# Patient Record
Sex: Male | Born: 1940 | Race: White | Hispanic: No
Health system: Southern US, Community
[De-identification: ages and names within clinical notes are randomized; demographics above are authoritative.]

## PROBLEM LIST (undated history)

## (undated) DIAGNOSIS — F0390 Unspecified dementia without behavioral disturbance: Secondary | ICD-10-CM

## (undated) HISTORY — PX: OTHER SURGICAL HISTORY: SHX169

---

## 2020-10-06 ENCOUNTER — Emergency Department: Payer: Medicare Other

## 2020-10-06 ENCOUNTER — Other Ambulatory Visit: Payer: Self-pay

## 2020-10-06 ENCOUNTER — Emergency Department
Admission: EM | Admit: 2020-10-06 | Discharge: 2020-10-07 | Disposition: A | Discharge status: Home / Self Care | Payer: Medicare Other | Attending: Emergency Medicine | Admitting: Emergency Medicine

## 2020-10-06 DIAGNOSIS — F0390 Unspecified dementia without behavioral disturbance: Secondary | ICD-10-CM | POA: Diagnosis not present

## 2020-10-06 DIAGNOSIS — G319 Degenerative disease of nervous system, unspecified: Secondary | ICD-10-CM | POA: Insufficient documentation

## 2020-10-06 DIAGNOSIS — Z20822 Contact with and (suspected) exposure to covid-19: Secondary | ICD-10-CM | POA: Diagnosis not present

## 2020-10-06 DIAGNOSIS — E860 Dehydration: Secondary | ICD-10-CM | POA: Insufficient documentation

## 2020-10-06 DIAGNOSIS — R4182 Altered mental status, unspecified: Secondary | ICD-10-CM | POA: Diagnosis present

## 2020-10-06 DIAGNOSIS — R531 Weakness: Secondary | ICD-10-CM

## 2020-10-06 HISTORY — DX: Unspecified dementia, unspecified severity, without behavioral disturbance, psychotic disturbance, mood disturbance, and anxiety: F03.90

## 2020-10-06 LAB — COMPREHENSIVE METABOLIC PANEL
ALT: 23 U/L (ref 0–44)
AST: 59 U/L — ABNORMAL HIGH (ref 15–41)
Albumin: 4 g/dL (ref 3.5–5.0)
Alkaline Phosphatase: 37 U/L — ABNORMAL LOW (ref 38–126)
Anion gap: 10 (ref 5–15)
BUN: 31 mg/dL — ABNORMAL HIGH (ref 8–23)
CO2: 26 mmol/L (ref 22–32)
Calcium: 9.1 mg/dL (ref 8.9–10.3)
Chloride: 103 mmol/L (ref 98–111)
Creatinine, Ser: 0.67 mg/dL (ref 0.61–1.24)
GFR, Estimated: >60 mL/min (ref >60)
Glucose, Bld: 121 mg/dL — ABNORMAL HIGH (ref 70–99)
Potassium: 3.7 mmol/L (ref 3.5–5.1)
Sodium: 139 mmol/L (ref 135–145)
Total Bilirubin: 0.8 mg/dL (ref 0.3–1.2)
Total Protein: 7.6 g/dL (ref 6.5–8.1)

## 2020-10-06 LAB — CBC
HCT: 38.6 % — ABNORMAL LOW (ref 39.0–52.0)
Hemoglobin: 13 g/dL (ref 13.0–17.0)
MCH: 32.7 pg (ref 26.0–34.0)
MCHC: 33.7 g/dL (ref 30.0–36.0)
MCV: 97 fL (ref 80.0–100.0)
Platelets: 246 10*3/uL (ref 150–400)
RBC: 3.98 MIL/uL — ABNORMAL LOW (ref 4.22–5.81)
RDW: 14 % (ref 11.5–15.5)
WBC: 10.4 10*3/uL (ref 4.0–10.5)
nRBC: 0 % (ref 0.0–0.2)

## 2020-10-06 LAB — URINALYSIS, COMPLETE (UACMP) WITH MICROSCOPIC
Bacteria, UA: NONE SEEN
Bilirubin Urine: NEGATIVE
Glucose, UA: NEGATIVE mg/dL
Hgb urine dipstick: NEGATIVE
Ketones, ur: 20 mg/dL — AB
Leukocytes,Ua: NEGATIVE
Nitrite: NEGATIVE
Protein, ur: 30 mg/dL — AB
Specific Gravity, Urine: 1.036 — ABNORMAL HIGH (ref 1.005–1.030)
pH: 5 (ref 5.0–8.0)

## 2020-10-06 LAB — LACTIC ACID, PLASMA: Lactic Acid, Venous: 1 mmol/L (ref 0.5–1.9)

## 2020-10-06 LAB — RESP PANEL BY RT-PCR (FLU A&B, COVID) ARPGX2
Influenza A by PCR: NEGATIVE
Influenza B by PCR: NEGATIVE
SARS Coronavirus 2 by RT PCR: NEGATIVE

## 2020-10-06 NOTE — ED Notes (Signed)
Patient transported to CT 

## 2020-10-06 NOTE — ED Triage Notes (Signed)
Patient sent by PCP for contaminated blood work that was collected this AM. From [facility]. Staff reports altered from baseline,lethargic, and unable to walk.

## 2020-10-06 NOTE — ED Notes (Signed)
Pt currently resting in NAD. Stretcher locked in lowest position with side rails raised for fall prevention.

## 2020-10-06 NOTE — ED Provider Notes (Signed)
[facility] Emergency Department Provider Note   ____________________________________________   Event Date/Time   First MD Initiated Contact with Patient 10/06/20 1940     (approximate)  I have reviewed the triage vital signs and the nursing notes.   HISTORY  Chief Complaint Weakness    HPI Duane Anderson is a 80 y.o. male with a past medical history of dementia who presents from his long-term care facility via EMS for altered mental status.  Per staff patient was "not as arousable as usual" and has had difficulty ambulating over the last few days.  Staff does not provide any further history at this time.  Patient is unable to participate in history or review of systems but does not complain of any pain or other complaints at this time          Past Medical History:  Diagnosis Date   Dementia (HCC)     There are no problems to display for this patient.    Prior to Admission medications   Not on File    Allergies Patient has no known allergies.  No family history on file.  Social History    Review of Systems Unable to assess   ____________________________________________   PHYSICAL EXAM:  VITAL SIGNS: ED Triage Vitals [10/06/20 1653]  Enc Vitals Group     BP 93/61     Pulse Rate 84     Resp 18     Temp 98.6 F (37 C)     Temp Source Oral     SpO2 100 %     Weight 160 lb (72.6 kg)     Height 5\' 8"  (1.727 m)     Head Circumference      Peak Flow      Pain Score      Pain Loc      Pain Edu?      Excl. in GC?    Constitutional: Alert and disoriented.  Disheveled elderly Caucasian male resting comfortably on stretcher in no acute distress. Eyes: Conjunctivae are normal. PERRL. Head: Atraumatic. Nose: No congestion/rhinnorhea. Mouth/Throat: Mucous membranes are moist. Neck: No stridor Cardiovascular: Grossly normal heart sounds.  Good peripheral circulation. Respiratory: Normal respiratory effort.  No  retractions. Gastrointestinal: Soft and nontender. No distention. Musculoskeletal: No obvious deformities Neurologic:  Normal speech and language.  Moves all extremities spontaneously Skin:  Skin is warm and dry. No rash noted. Psychiatric: Cooperative  ____________________________________________   LABS (all labs ordered are listed, but only abnormal results are displayed)  Labs Reviewed  CBC - Abnormal; Notable for the following components:      Result Value   RBC 3.98 (*)    HCT 38.6 (*)    All other components within normal limits  COMPREHENSIVE METABOLIC PANEL - Abnormal; Notable for the following components:   Glucose, Bld 121 (*)    BUN 31 (*)    AST 59 (*)    Alkaline Phosphatase 37 (*)    All other components within normal limits  URINALYSIS, COMPLETE (UACMP) WITH MICROSCOPIC - Abnormal; Notable for the following components:   Color, Urine AMBER (*)    APPearance HAZY (*)    Specific Gravity, Urine 1.036 (*)    Ketones, ur 20 (*)    Protein, ur 30 (*)    All other components within normal limits  RESP PANEL BY RT-PCR (FLU A&B, COVID) ARPGX2  LACTIC ACID, PLASMA   RADIOLOGY  ED MD interpretation: CT of the head without contrast shows  no evidence of acute abnormalities including no intracerebral hemorrhage, obvious masses, or significant edema.  There is generalized cerebral atrophy most severe in the temporal lobes  Official radiology report(s): CT Head Wo Contrast  Result Date: 10/06/2020 CLINICAL DATA:  Mental status change EXAM: CT HEAD WITHOUT CONTRAST TECHNIQUE: Contiguous axial images were obtained from the base of the skull through the vertex without intravenous contrast. COMPARISON:  None. FINDINGS: Brain: Patient scanned in the decubitus position. Generalized atrophy. Prominent atrophy in the temporal lobes. Ventricular enlargement consistent with atrophy. Mild white matter hypodensity bilaterally. Negative for acute infarct, hemorrhage, mass Vascular:  Negative for hyperdense vessel Skull: Negative Sinuses/Orbits: Retention cyst left maxillary sinus. Remaining sinuses clear. Bilateral cataract extraction Other: None IMPRESSION: Generalized atrophy most severe in the temporal lobes. No acute abnormality. Electronically Signed   By: Marlan Palau M.D.   On: 10/06/2020 20:31    ____________________________________________   PROCEDURES  Procedure(s) performed (including Critical Care):  .1-3 Lead EKG Interpretation  Date/Time: 10/06/2020 8:51 PM Performed by: Merwyn Katos, MD Authorized by: Merwyn Katos, MD     Interpretation: normal     ECG rate:  89   ECG rate assessment: normal     Rhythm: sinus rhythm     Ectopy: none     Conduction: normal     ____________________________________________   INITIAL IMPRESSION / ASSESSMENT AND PLAN / ED COURSE  As part of my medical decision making, I reviewed the following data within the electronic medical record, if available:  Nursing notes reviewed and incorporated, Labs reviewed, EKG interpreted, Old chart reviewed, Radiograph reviewed and Notes from prior ED visits reviewed and incorporated        The patient suffered an episode of altered mental status, but there is no overt concern for a dangerous emergent cause such as, but not limited to, CNS infection, severe Toxidrome, severe metabolic derangement, or stroke.  Given History, Physical, and Workup the cause appears to be possible dehydration  Disposition: Discharge. At the time of discharge, the patient is back to presumed baseline mental status.      ____________________________________________   FINAL CLINICAL IMPRESSION(S) / ED DIAGNOSES  Final diagnoses:  Generalized weakness  Dehydration  Diffuse cerebral atrophy [facility])     ED Discharge Orders     None        Note:  This document was prepared using Dragon voice recognition software and may include unintentional dictation errors.    Merwyn Katos, MD 10/06/20 2051

## 2020-10-06 NOTE — ED Notes (Signed)
Spoke to POA/pt's son and explained dispo. No further questions or concerns.

## 2020-10-13 ENCOUNTER — Inpatient Hospital Stay
Admission: EM | Admit: 2020-10-13 | Discharge: 2020-10-23 | DRG: 884 | Disposition: A | Discharge status: Hospice | Payer: Medicare Other | Source: Skilled Nursing Facility | Attending: Internal Medicine | Admitting: Internal Medicine

## 2020-10-13 ENCOUNTER — Emergency Department: Payer: Medicare Other

## 2020-10-13 ENCOUNTER — Other Ambulatory Visit: Payer: Self-pay

## 2020-10-13 DIAGNOSIS — M6282 Rhabdomyolysis: Secondary | ICD-10-CM | POA: Diagnosis present

## 2020-10-13 DIAGNOSIS — E860 Dehydration: Secondary | ICD-10-CM | POA: Diagnosis present

## 2020-10-13 DIAGNOSIS — G9341 Metabolic encephalopathy: Secondary | ICD-10-CM | POA: Diagnosis present

## 2020-10-13 DIAGNOSIS — Z66 Do not resuscitate: Secondary | ICD-10-CM | POA: Diagnosis present

## 2020-10-13 DIAGNOSIS — E538 Deficiency of other specified B group vitamins: Secondary | ICD-10-CM | POA: Diagnosis present

## 2020-10-13 DIAGNOSIS — F0390 Unspecified dementia without behavioral disturbance: Secondary | ICD-10-CM | POA: Diagnosis present

## 2020-10-13 DIAGNOSIS — Z6825 Body mass index (BMI) 25.0-25.9, adult: Secondary | ICD-10-CM

## 2020-10-13 DIAGNOSIS — Z7401 Bed confinement status: Secondary | ICD-10-CM

## 2020-10-13 DIAGNOSIS — Z79899 Other long term (current) drug therapy: Secondary | ICD-10-CM

## 2020-10-13 DIAGNOSIS — R627 Adult failure to thrive: Secondary | ICD-10-CM | POA: Diagnosis present

## 2020-10-13 DIAGNOSIS — E440 Moderate protein-calorie malnutrition: Secondary | ICD-10-CM | POA: Diagnosis present

## 2020-10-13 DIAGNOSIS — R748 Abnormal levels of other serum enzymes: Secondary | ICD-10-CM | POA: Diagnosis not present

## 2020-10-13 DIAGNOSIS — G9340 Encephalopathy, unspecified: Secondary | ICD-10-CM | POA: Diagnosis present

## 2020-10-13 DIAGNOSIS — N19 Unspecified kidney failure: Secondary | ICD-10-CM | POA: Diagnosis not present

## 2020-10-13 DIAGNOSIS — D72829 Elevated white blood cell count, unspecified: Secondary | ICD-10-CM | POA: Diagnosis present

## 2020-10-13 DIAGNOSIS — E8809 Other disorders of plasma-protein metabolism, not elsewhere classified: Secondary | ICD-10-CM | POA: Diagnosis present

## 2020-10-13 DIAGNOSIS — Z20822 Contact with and (suspected) exposure to covid-19: Secondary | ICD-10-CM | POA: Diagnosis present

## 2020-10-13 DIAGNOSIS — R4182 Altered mental status, unspecified: Secondary | ICD-10-CM

## 2020-10-13 LAB — CBC WITH DIFFERENTIAL/PLATELET
Abs Immature Granulocytes: 0.09 10*3/uL — ABNORMAL HIGH (ref 0.00–0.07)
Basophils Absolute: 0.1 10*3/uL (ref 0.0–0.1)
Basophils Relative: 1 %
Eosinophils Absolute: 0.2 10*3/uL (ref 0.0–0.5)
Eosinophils Relative: 2 %
HCT: 33.7 % — ABNORMAL LOW (ref 39.0–52.0)
Hemoglobin: 11.8 g/dL — ABNORMAL LOW (ref 13.0–17.0)
Immature Granulocytes: 1 %
Lymphocytes Relative: 14 %
Lymphs Abs: 1.6 10*3/uL (ref 0.7–4.0)
MCH: 33.7 pg (ref 26.0–34.0)
MCHC: 35 g/dL (ref 30.0–36.0)
MCV: 96.3 fL (ref 80.0–100.0)
Monocytes Absolute: 1.2 10*3/uL — ABNORMAL HIGH (ref 0.1–1.0)
Monocytes Relative: 11 %
Neutro Abs: 8 10*3/uL — ABNORMAL HIGH (ref 1.7–7.7)
Neutrophils Relative %: 71 %
Platelets: 313 10*3/uL (ref 150–400)
RBC: 3.5 MIL/uL — ABNORMAL LOW (ref 4.22–5.81)
RDW: 13.6 % (ref 11.5–15.5)
WBC: 11.1 10*3/uL — ABNORMAL HIGH (ref 4.0–10.5)
nRBC: 0 % (ref 0.0–0.2)

## 2020-10-13 LAB — TROPONIN I (HIGH SENSITIVITY)
Troponin I (High Sensitivity): 9 ng/L (ref <18)
Troponin I (High Sensitivity): 10 ng/L (ref <18)

## 2020-10-13 LAB — COMPREHENSIVE METABOLIC PANEL
ALT: 27 U/L (ref 0–44)
AST: 51 U/L — ABNORMAL HIGH (ref 15–41)
Albumin: 3.3 g/dL — ABNORMAL LOW (ref 3.5–5.0)
Alkaline Phosphatase: 41 U/L (ref 38–126)
Anion gap: 11 (ref 5–15)
BUN: 30 mg/dL — ABNORMAL HIGH (ref 8–23)
CO2: 23 mmol/L (ref 22–32)
Calcium: 8.7 mg/dL — ABNORMAL LOW (ref 8.9–10.3)
Chloride: 105 mmol/L (ref 98–111)
Creatinine, Ser: 0.61 mg/dL (ref 0.61–1.24)
GFR, Estimated: >60 mL/min (ref >60)
Glucose, Bld: 112 mg/dL — ABNORMAL HIGH (ref 70–99)
Potassium: 3.7 mmol/L (ref 3.5–5.1)
Sodium: 139 mmol/L (ref 135–145)
Total Bilirubin: 0.8 mg/dL (ref 0.3–1.2)
Total Protein: 6.6 g/dL (ref 6.5–8.1)

## 2020-10-13 LAB — RESP PANEL BY RT-PCR (FLU A&B, COVID) ARPGX2
Influenza A by PCR: NEGATIVE
Influenza B by PCR: NEGATIVE
SARS Coronavirus 2 by RT PCR: NEGATIVE

## 2020-10-13 LAB — MAGNESIUM: Magnesium: 2.1 mg/dL (ref 1.7–2.4)

## 2020-10-13 LAB — CK: Total CK: 965 U/L — ABNORMAL HIGH (ref 49–397)

## 2020-10-13 MED ORDER — ACETAMINOPHEN 650 MG RE SUPP: 650.0000 mg | Freq: Four times a day (QID) | RECTAL | Status: DC | PRN

## 2020-10-13 MED ORDER — LACTATED RINGERS IV BOLUS
1000.0000 mL | Freq: Once | INTRAVENOUS | Status: AC
  Administered 2020-10-13: 1000 mL via INTRAVENOUS

## 2020-10-13 MED ORDER — ACETAMINOPHEN 325 MG PO TABS
650.0000 mg | ORAL_TABLET | Freq: Four times a day (QID) | ORAL | Status: DC | PRN
  Administered 2020-10-15 – 2020-10-16 (×2): 650 mg via ORAL
  Filled 2020-10-13 (×2): qty 2

## 2020-10-13 NOTE — ED Notes (Signed)
Report received from ED RN Francee Gentile

## 2020-10-13 NOTE — H&P (Signed)
History and Physical    PLEASE NOTE THAT DRAGON DICTATION SOFTWARE WAS USED IN THE CONSTRUCTION OF THIS NOTE.   Duane Anderson YFV:[id number] DOB: [birth year] DOA: 10/13/2020  PCP: System, Provider Not In Patient coming from: SNF  I have personally briefly reviewed patient's old medical records in [facility] Link  Chief Complaint: Lethargy  HPI: Duane Anderson is a 80 y.o. male with medical history significant for dementia, vitamin B12 deficiency, who is admitted to [facility] on 10/13/2020 with acute encephalopathy after presenting from SNF to [facility] ED for further evaluation of lethargy.   In the context of the patient's current mental status, including underlying dementia, the following history is provided by SNF staff, my discussions with the EDP, and via chart review.  While the patient has a history of underlying dementia, SNF staff report that he has exhibited 1 week of progressive lethargy and diminished responsiveness relative to his baseline.  Relative to reported baseline, which the patient is confused, but interactive, able to follow instructions, and able to ambulate with standby assistance, SNF staff report that the patient has exhibited evidence of progressive lethargy over the course of the last 1 week, in which the patient will now barely open his eyes to verbal stimuli, unable to follow instructions, and has been nonambulatory over the last several days.  No known preceding trauma.  In the setting of his progressive lethargy, the patient was brought to [facility] ED on 10/06/2020 for further evaluation, at which time laboratory work-up and CT head reportedly demonstrated no significant contributory findings, including CT head showing no evidence of acute intracranial process.  There was reportedly suspicion for pharmacologic contribution from home with Depakote, risperidone, Zoloft, trazodone. the patient was discharged from the ED back to his SNF, with instructions to hold his  outpatient Depakote, risperidone, Zoloft, trazodone to allow for a washout period to evaluate for encephalopathic contributions from these medications.  SNF staff reports that they have been compliant in holding the above medications in the interval since the patient's ED visit on 10/06/2020.  However, in spite of compliantly holding these medications, staff reported further worsening of his lethargy, during which time the patient has exhibited diminished oral intake , prompting the patient be brought back to [facility] ED today for further evaluation management thereof.   They deny any known recent vomiting or diarrhea.  No known recent COVID-19 exposures.  Not on any statin medications.  His medical history is notable for vitamin B12 deficiency for which she is prescribed daily oral vitamin B12 supplementation.    ED Course:  Vital signs in the ED were notable for the following: Temperature max 98.5, heart rate 67-84; blood pressure 105/54 -132/96; respiratory rate 16-26, oxygen saturation 94 to 100% on room air.  Labs were notable for the following: CMP was notable for the following: Sodium 139, potassium 3.7, bicarbonate 23, anion gap 11, BUN 30, creatinine 0.61, glucose 112, calcium corrected for hypoalbuminemia was noted to be 9.2, albumin 3.3, AST 51 compared to 59 on 10/06/2020, ALT 27, alkaline phosphatase 41, total bilirubin 0.8.  CBC notable for the following: Lipid cell count of 11,100 with 71% neutrophils.  CPK 967, with no prior available CPK level for point comparison.  High-sensitivity troponin I initially noted to be 9, with repeat value found to be 10.  Urinalysis has been ordered, with result currently pending.  Nasopharyngeal COVID-19 PCR screen was checked in the ED this evening, with result currently pending.  Chest x-ray showed low  lung volumes and subtle patchy opacity in the left perihilar region suggestive of atelectasis, although infection could not be excluded, per radiology read.   Otherwise, chest x-ray demonstrated no evidence of overt infiltrate, edema, or pneumothorax.  Noncontrast CT head showed no evidence of acute intracranial process, including no evidence of acute infarct or intracranial hemorrhage, and appeared unchanged from CT head performed on 10/06/2020.  While in the ED, the following were administered: Lactated Ringer's x1 L bolus.       Review of Systems: As per HPI otherwise 10 point review of systems negative.   Past Medical History:  Diagnosis Date   Dementia [facility])     Past Surgical History:  Procedure Laterality Date   arcochordon     surgically removed    Social History:  reports previous alcohol use. He reports previous drug use. No history on file for tobacco use.   No Known Allergies  History reviewed. No pertinent family history.   No current facility-administered medications on file prior to encounter.   Current Outpatient Medications on File Prior to Encounter  Medication Sig Dispense Refill   acetaminophen (TYLENOL) 500 MG tablet Take 1,000 mg by mouth 2 (two) times daily.     Multiple Vitamin (MULTIVITAMIN WITH MINERALS) TABS tablet Take 1 tablet by mouth daily.     NON FORMULARY Apply 1 application topically 2 (two) times daily as needed. Lorazepam 0.5 mg gel     vitamin B-12 (CYANOCOBALAMIN) 500 MCG tablet Take 500 mcg by mouth daily.     divalproex (DEPAKOTE SPRINKLE) 125 MG capsule Take 250 mg by mouth 2 (two) times daily.     risperiDONE (RISPERDAL) 1 MG tablet Take 1 mg by mouth 2 (two) times daily.     sertraline (ZOLOFT) 100 MG tablet Take 100 mg by mouth at bedtime.     traZODone (DESYREL) 50 MG tablet Take 50 mg by mouth at bedtime.       Objective    Physical Exam: Vitals:   10/13/20 1630 10/13/20 1730 10/13/20 1800 10/13/20 1830  BP: (!) 118/104 115/88 (!) 117/58   Pulse: 83 85  91  Resp: 19 19  17   Temp:      TempSrc:      SpO2: 98% 99%  99%  Weight:      Height:        General: appears to be  stated age; lethargic; will briefly open eyes to verbal stimuli; unable to follow instructions.  Nonverbal. Skin: warm, dry, no rash Head:  AT/[place] Mouth:  Oral mucosa membranes appear dry, normal dentition Neck: supple; trachea midline Heart:  RRR; did not appreciate any M/R/G Lungs: CTAB, did not appreciate any wheezes, rales, or rhonchi Abdomen: + BS; soft, ND, NT Vascular: 2+ pedal pulses b/l; 2+ radial pulses b/l Extremities: no peripheral edema, no muscle wasting Neuro: In the setting of the patient's current mental status and associated inability to follow instructions, unable to perform full neurologic exam at this time.  As such, assessment of strength, sensation, and cranial nerves is limited at this time. Patient noted to spontaneously move all 4 extremities.     Labs on Admission: I have personally reviewed following labs and imaging studies  CBC: Recent Labs  Lab 10/13/20 1546  WBC 11.1*  NEUTROABS 8.0*  HGB 11.8*  HCT 33.7*  MCV 96.3  PLT 313   Basic Metabolic Panel: Recent Labs  Lab 10/13/20 1546  NA 139  K 3.7  CL 105  CO2 23  GLUCOSE 112*  BUN 30*  CREATININE 0.61  CALCIUM 8.7*   GFR: Estimated Creatinine Clearance: 75.7 mL/min (by C-G formula based on SCr of 0.61 mg/dL). Liver Function Tests: Recent Labs  Lab 10/13/20 1546  AST 51*  ALT 27  ALKPHOS 41  BILITOT 0.8  PROT 6.6  ALBUMIN 3.3*   No results for input(s): LIPASE, AMYLASE in the last 168 hours. No results for input(s): AMMONIA in the last 168 hours. Coagulation Profile: No results for input(s): INR, PROTIME in the last 168 hours. Cardiac Enzymes: Recent Labs  Lab 10/13/20 1546  CKTOTAL 965*   BNP (last 3 results) No results for input(s): PROBNP in the last 8760 hours. HbA1C: No results for input(s): HGBA1C in the last 72 hours. CBG: No results for input(s): GLUCAP in the last 168 hours. Lipid Profile: No results for input(s): CHOL, HDL, LDLCALC, TRIG, CHOLHDL, LDLDIRECT in  the last 72 hours. Thyroid Function Tests: No results for input(s): TSH, T4TOTAL, FREET4, T3FREE, THYROIDAB in the last 72 hours. Anemia Panel: No results for input(s): VITAMINB12, FOLATE, FERRITIN, TIBC, IRON, RETICCTPCT in the last 72 hours. Urine analysis:    Component Value Date/Time   COLORURINE AMBER (A) 10/06/2020 1942   APPEARANCEUR HAZY (A) 10/06/2020 1942   LABSPEC 1.036 (H) 10/06/2020 1942   PHURINE 5.0 10/06/2020 1942   GLUCOSEU NEGATIVE 10/06/2020 1942   HGBUR NEGATIVE 10/06/2020 1942   BILIRUBINUR NEGATIVE 10/06/2020 1942   KETONESUR 20 (A) 10/06/2020 1942   PROTEINUR 30 (A) 10/06/2020 1942   NITRITE NEGATIVE 10/06/2020 1942   LEUKOCYTESUR NEGATIVE 10/06/2020 1942    Radiological Exams on Admission: CT HEAD WO CONTRAST (5MM)  Result Date: 10/13/2020 CLINICAL DATA:  Mental status change, unknown cause. Additional history provided: Patient not following commands, but responding to pain. EXAM: CT HEAD WITHOUT CONTRAST TECHNIQUE: Contiguous axial images were obtained from the base of the skull through the vertex without intravenous contrast. COMPARISON:  Head CT 10/06/2020. FINDINGS: Brain: Moderate cerebral atrophy, most prominent within the frontal and temporal lobes. Commensurate prominence of the ventricles and sulci. Mild patchy and ill-defined hypoattenuation within the cerebral white matter, nonspecific but compatible with chronic small vessel ischemic disease. There is no acute intracranial hemorrhage. No demarcated cortical infarct. No extra-axial fluid collection. No evidence of an intracranial mass. No midline shift. Vascular: No hyperdense vessel.  Atherosclerotic calcifications. Skull: Normal. Negative for fracture or focal lesion. Sinuses/Orbits: Visualized orbits show no acute finding. Incompletely imaged left maxillary sinus mucous retention cyst measuring at least 19 mm. IMPRESSION: No evidence of acute intracranial abnormality. Moderate cerebral atrophy, most  prominent within the frontal and temporal lobes. Mild chronic small-vessel ischemic changes within the cerebral white matter. Incompletely imaged left maxillary sinus mucous retention cyst. Electronically Signed   By: Jackey LogeKyle  Golden DO   On: 10/13/2020 16:38   DG Chest Portable 1 View  Result Date: 10/13/2020 CLINICAL DATA:  Altered mental status.  Rule out pneumonia. EXAM: PORTABLE CHEST 1 VIEW COMPARISON:  None. FINDINGS: Subtle patchy densities in the left perihilar region. Right lung is clear. Decreased lung volumes. Heart size is within normal limits. Negative for a pneumothorax. IMPRESSION: Low lung volumes with patchy densities in left perihilar region. Infection cannot be excluded. However, findings could be related to atelectasis or asymmetric vascular congestion/edema. Electronically Signed   By: Richarda OverlieAdam  Henn M.D.   On: 10/13/2020 16:09      Assessment/Plan   Duane KeyJames Anderson is a 80 y.o. male with medical history significant for dementia, vitamin B12  deficiency, who is admitted to [facility] on 10/13/2020 with acute encephalopathy after presenting from SNF to [facility] ED for further evaluation of lethargy.    Principal Problem:   Acute encephalopathy Active Problems:   Elevated CPK   Acute prerenal azotemia   Leukocytosis   B12 deficiency   Dehydration    #) Acute encephalopathy: Relative to baseline dementia, the patient presents with 1 week of progressive lethargy, diminished responsiveness to verbal stimuli, diminished ambulatory activity, and inability to follow instructions.  Relative to his baseline dementia, this appears to represent an acute departure from his baseline mental status, responsiveness, and activity level over the course of the last week.  Underlying etiology not entirely clear at this time, although metabolic sources are a possiblity.  Lethargy/somnolence continued to worsen following interval holding of home central acting medications, as further  detailed above, thereby decreasing likelihood of significant contribution from pharmacologic factors.  No overt evidence of underlying infectious process, although urinalysis result is currently pending.  COVID-19 PCR screen performed in the ED today was found to be negative.  Chest x-ray shows patchy opacity in the left perihilar region felt to be consistent with atelectasis, although, per radiology read, infection cannot be excluded.  Consequently, we will further evaluate for underlying pneumonia by checking procalcitonin level.  Does not appear to be septic at this time.  Depending result of procalcitonin level, can consider pursuing CT chest to further evaluate if this laboratory level offers equivocal results.  Presenting CPK found to be mildly elevated, but not to a quantitative threshold typically associated with rhabdomyolysis.  We will closely monitor ensuing CPK trend while providing interval IV fluids, and further evaluating with urinalysis , as further detailed below.  Otherwise, no evidence of significant metabolic contributions at this time.  Of note, the patient has documented history of vitamin B12 deficiency, and will check MMA level to further evaluate for any encephalopathic contributions thereof.  CT head shows no evidence of acute intracranial process, and appears unchanged relative to CT head from 10/06/2020.  If the above work-up is not suggestive of underlying cause of patient's acute encephalopathy, could consider pursuit of MRI brain at that time.  As the patient is greater than 1 week out from his last known normal, he is well outside of the window for tPA administration.   Plan: Keep n.p.o. until mental status improves such that the patient is able to participate in and pass nursing bedside swallow screen, which has been ordered.  Add on procalcitonin level, as above.  Follow-up result urinalysis.  Check ammonia level, ionized calcium level.  Check MMA, TSH, urinary drug screen, and  VBG.  Repeat CMP and CBC in the morning.  Continuous IV fluids overnight, as further detailed below.  Repeat CPK level in the morning.      #) Elevated CPK: Presenting CPK level found to be mildly elevated at 965, which does not meet quantitative threshold for rhabdomyolysis at this time.  However, we will closely trend ensuing CPK level, as presentation may be consistent with early/evolving rhabdomyolysis. Will also further assess by checking urinalysis, with close attention for results consistent with myoglobinuria.  No reported recent trauma, and it does not appear that the patient is on a home statin.  It appears that the patient has developed a mild tremor following recent discontinuation of his home central acting medications, as further quantified above, which may be contributing to mildly elevated CPK level.  No overt evidence of seizures.  Given patient's age and the absence of overt rhabdomyolysis at this time, will initiate IV fluids, but at a rate less aggressive relative to the typically initiated for full-blown rhabdomyolysis.  Of note, it appears that the patient has been largely bedbound over the course of the last few days in the setting of progressive worsening of his lethargy, and associated with diminished oral intake, including that of water over the last week, all representing potential contributing factors leading to mildly elevated presenting CPK level.  Plan: Lactated Ringer's at 100 cc/h x 12 hours.  Repeat CPK level in the morning.  Check EKG.  Add on serum magnesium level and check serum phosphorus level.  Check urinalysis, including for the presence of myoglobinuria.  Repeat CMP in the morning, with close attention to interval trend in renal function.  Check VBG.      #) Dehydration: Diagnosis on the basis of presenting labs reflecting prerenal azotemia without overt AKI, while physical exam is notable for the presence of dry oral mucosa membranes, while in the context of  significant decline in oral intake over the last several days, as further detailed above.  No evidence of associated hypotension.  Plan: Check urinalysis.  Continuous lactated Ringer's x12 hours, as further detailed above.  Monitor strict I's and O's Daily weights.  Repeat CMP in the morning.      #) Leukocytosis: Present labs reflect mildly elevated white blood cell count, without overt evidence of underlying infectious process, although urinalysis results currently pending.  Additionally, we will pursue stool requalify nonspecific patchy opacity left perihilar region identified on chest x-ray, as further detailed above.  Of note, SIRS criteria otherwise for sepsis.  Suspect contribution towards leukocytosis as a result of hemoconcentration effects of presenting dehydration, as above.  Plan: Check urinalysis and procalcitonin, as above.  Repeat CBC with differential in the morning.  Continuous lactated Ringer's, as above.  Monitor strict I's and O's Daily weights.       #) Vitamin B12 deficiency: Documented history of such, with the patient prescribed daily oral vitamin B12 supplementation, which she has been unable to take over the course of the last several days in the setting of progressive lethargy and consequential decline in oral intake.   Plan: Check MMA level.  We will continue to hold outpatient oral B12 supplementation until the patient is able to pass his nursing bedside swallow evaluation.     DVT prophylaxis: scd's  Code Status: DNR Family Communication: none Disposition Plan: Per Rounding Team Consults called: none  Admission status: Inpatient; MedSurg     Of note, this patient was added by me to the following Admit List/Treatment Team: [facility].      PLEASE NOTE THAT DRAGON DICTATION SOFTWARE WAS USED IN THE CONSTRUCTION OF THIS NOTE.   Angie Fava DO [facility] Pager [phone] From 6PM - 2AM  Otherwise, please contact night-coverage   [email or web address] Password [email or web address]   10/13/2020, 7:07 PM

## 2020-10-13 NOTE — ED Triage Notes (Signed)
Patient brought in via EMS from [facility]. Patient was seen last week for shaking movements. Sent back and was taken off mental health meds per ems. Sent back here today for same symptoms. Patient not following commands but responds to pain

## 2020-10-13 NOTE — Progress Notes (Signed)
Brief note regarding preliminary plan, with full H&P to follow:  81 year old male with history of dementia who is admitted for further evaluation management of acute encephalopathy after presenting from SNF for further evaluation of 1 week of progressive confusion and lethargy relative to his baseline mental status.  No evidence of underlying factious process thus far, including negative COVID-19 screen.  Result urinalysis pending.  NPO until mental status improves sufficiently such that the patient is able to dissipate in and pass nursing bedside swallow screen.  DNR.     Newton Pigg, DO Hospitalist

## 2020-10-13 NOTE — ED Notes (Signed)
External catheter placed on patient. Brief changed.

## 2020-10-13 NOTE — ED Provider Notes (Signed)
[facility]  ____________________________________________   Event Date/Time   First MD Initiated Contact with Patient 10/13/20 1521     (approximate)  I have reviewed the triage vital signs and the nursing notes.   HISTORY  Chief Complaint Altered Mental Status    HPI Duane Anderson is a 80 y.o. male past medical history of dementia who presents with lethargy and shaking movements.  History is obtained by caregiver at his facility who notes that over the last week he has had more difficulty ambulating than normal.  Was seen in the ED about 5 days ago for this and was discharged back to the facility.  They then took him off of several of his psych meds including Depakote, risperidone, sertraline and trazodone to see if this would improve his lethargy, but this has not made a difference.  Then over the past 2 days he has had some jerking and shaking movements.  They have not noted any falls or any fevers.  Unable to obtain additional history regarding onset, associated symptoms, quality or timing secondary to patient's altered mental status.     Past Medical History:  Diagnosis Date   Dementia (HCC)     There are no problems to display for this patient.   History reviewed. No pertinent surgical history.  Prior to Admission medications   Not on File    Allergies Patient has no known allergies.  History reviewed. No pertinent family history.  Social History Patient is DNR    Review of Systems   Review of Systems  Unable to perform ROS: Dementia   Physical Exam Updated Vital Signs BP (!) 118/104   Pulse 83   Temp 98.5 F (36.9 C) (Axillary)   Resp 19   Ht 5\' 8"  (1.727 m)   Wt 85.3 kg   SpO2 98%   BMI 28.59 kg/m   Physical Exam Vitals and nursing note reviewed.  Constitutional:      General: He is not in acute distress.    Appearance: Normal appearance.     Comments: Chronically ill-appearing  HENT:     Head: Normocephalic and  atraumatic.  Eyes:     General: No scleral icterus.    Conjunctiva/sclera: Conjunctivae normal.     Comments: Pinpoint pupils  Cardiovascular:     Rate and Rhythm: Normal rate.  Pulmonary:     Effort: Pulmonary effort is normal. No respiratory distress.     Breath sounds: Normal breath sounds. No wheezing.  Abdominal:     General: Abdomen is flat.     Tenderness: There is no abdominal tenderness. There is no guarding.  Musculoskeletal:        General: Swelling present. No deformity or signs of injury.     Cervical back: Normal range of motion. No rigidity.     Comments: 2+ lower extremity edema bilaterally  Skin:    Coloration: Skin is not jaundiced or pale.  Neurological:     Mental Status: He is alert.     Comments: Patient opens eyes to sternal rub Minimal speech although does tell me that he is not in pain Increased tone in the bilateral upper extremities  Tremulousness in the bilateral upper extremities  Psychiatric:     Comments: Unable to assess given patient's altered mental status and dementia     LABS (all labs ordered are listed, but only abnormal results are displayed)  Labs Reviewed  CBC WITH DIFFERENTIAL/PLATELET - Abnormal; Notable for the following components:  Result Value   WBC 11.1 (*)    RBC 3.50 (*)    Hemoglobin 11.8 (*)    HCT 33.7 (*)    Neutro Abs 8.0 (*)    Monocytes Absolute 1.2 (*)    Abs Immature Granulocytes 0.09 (*)    All other components within normal limits  COMPREHENSIVE METABOLIC PANEL - Abnormal; Notable for the following components:   Glucose, Bld 112 (*)    BUN 30 (*)    Calcium 8.7 (*)    Albumin 3.3 (*)    AST 51 (*)    All other components within normal limits  CK - Abnormal; Notable for the following components:   Total CK 965 (*)    All other components within normal limits  RESP PANEL BY RT-PCR (FLU A&B, COVID) ARPGX2  URINALYSIS, COMPLETE (UACMP) WITH MICROSCOPIC  TROPONIN I (HIGH SENSITIVITY)  TROPONIN I (HIGH  SENSITIVITY)   ____________________________________________  EKG  ED ECG REPORT I, Randol Kern, the attending physician, personally viewed and interpreted this ECG.  Date: 10/13/2020  Rhythm: normal sinus rhythm w/ PVCs QRS Axis: normal Intervals: normal ST/T Wave abnormalities: normal Narrative Interpretation: no evidence of acute ischemia  ____________________________________________  RADIOLOGY I, Randol Kern, personally viewed and evaluated these images (plain radiographs) as part of my medical decision making, as well as reviewing the written report by the radiologist.  ED MD interpretation: I reviewed the chest x-ray which does not show any acute cardiopulmonary process    ____________________________________________   PROCEDURES  Procedure(s) performed (including Critical Care):  Procedures   ____________________________________________   INITIAL IMPRESSION / ASSESSMENT AND PLAN / ED COURSE  The patient is an 80 year old male with a history of dementia who presents with acute on chronic worsening mental status as well as new tremors.  Patient seen 5 days ago for decline difficulty ambulating and they then attempted to take off some of his psych meds.  However his lethargy has not improved and he is now tremulous.  On my exam he is significantly altered, responds to pain but does not provide any meaningful response.  Apparently he can take care of his ADLs normally but does have full assistance.  He does have tremulousness in the bilateral upper extremities.  Labs are overall assuring with a mild leukocytosis CK only mildly elevated.  On review does seem that withdrawal of all of those medications listed above can cause some tremulousness, this may be causing the symptoms.  However I do not have an explanation for why he can no longer ambulate and is more lethargic than normal.  Given he has not returned to baseline and this is now his second ED visit in the  last 5 days will admit for ongoing monitoring. Clinical Course as of 10/13/20 1755  Wed Oct 13, 2020  1644 IMPRESSION: No evidence of acute intracranial abnormality.   Moderate cerebral atrophy, most prominent within the frontal and temporal lobes.   Mild chronic small-vessel ischemic changes within the cerebral white matter.   Incompletely imaged left maxillary sinus mucous retention cyst.   [KM]    Clinical Course User Index [KM] Georga Hacking, MD     ____________________________________________   FINAL CLINICAL IMPRESSION(S) / ED DIAGNOSES  Final diagnoses:  Altered mental status, unspecified altered mental status type     ED Discharge Orders     None        Note:  This document was prepared using Dragon voice recognition software and may include unintentional  dictation errors.    Georga Hacking, MD 10/13/20 [phone]

## 2020-10-13 NOTE — ED Notes (Signed)
Telephone report given to [facility] RN

## 2020-10-14 DIAGNOSIS — D72829 Elevated white blood cell count, unspecified: Secondary | ICD-10-CM | POA: Diagnosis present

## 2020-10-14 DIAGNOSIS — E538 Deficiency of other specified B group vitamins: Secondary | ICD-10-CM | POA: Diagnosis present

## 2020-10-14 DIAGNOSIS — R748 Abnormal levels of other serum enzymes: Secondary | ICD-10-CM | POA: Diagnosis present

## 2020-10-14 DIAGNOSIS — N19 Unspecified kidney failure: Secondary | ICD-10-CM | POA: Diagnosis present

## 2020-10-14 DIAGNOSIS — E860 Dehydration: Secondary | ICD-10-CM | POA: Diagnosis present

## 2020-10-14 LAB — CBC WITH DIFFERENTIAL/PLATELET
Abs Immature Granulocytes: 0.08 10*3/uL — ABNORMAL HIGH (ref 0.00–0.07)
Basophils Absolute: 0 10*3/uL (ref 0.0–0.1)
Basophils Relative: 0 %
Eosinophils Absolute: 0.2 10*3/uL (ref 0.0–0.5)
Eosinophils Relative: 2 %
HCT: 34.2 % — ABNORMAL LOW (ref 39.0–52.0)
Hemoglobin: 11.3 g/dL — ABNORMAL LOW (ref 13.0–17.0)
Immature Granulocytes: 1 %
Lymphocytes Relative: 15 %
Lymphs Abs: 1.6 10*3/uL (ref 0.7–4.0)
MCH: 32.2 pg (ref 26.0–34.0)
MCHC: 33 g/dL (ref 30.0–36.0)
MCV: 97.4 fL (ref 80.0–100.0)
Monocytes Absolute: 0.9 10*3/uL (ref 0.1–1.0)
Monocytes Relative: 8 %
Neutro Abs: 8.2 10*3/uL — ABNORMAL HIGH (ref 1.7–7.7)
Neutrophils Relative %: 74 %
Platelets: 349 10*3/uL (ref 150–400)
RBC: 3.51 MIL/uL — ABNORMAL LOW (ref 4.22–5.81)
RDW: 13.4 % (ref 11.5–15.5)
WBC: 11 10*3/uL — ABNORMAL HIGH (ref 4.0–10.5)
nRBC: 0 % (ref 0.0–0.2)

## 2020-10-14 LAB — BLOOD GAS, VENOUS
Acid-Base Excess: 1.3 mmol/L (ref 0.0–2.0)
Bicarbonate: 27.7 mmol/L (ref 20.0–28.0)
O2 Saturation: 29.4 %
Patient temperature: 37
pCO2, Ven: 49 mmHg (ref 44.0–60.0)
pH, Ven: 7.36 (ref 7.250–7.430)
pO2, Ven: <31 mmHg — CL (ref 32.0–45.0)

## 2020-10-14 LAB — URINALYSIS, COMPLETE (UACMP) WITH MICROSCOPIC
Bilirubin Urine: NEGATIVE
Glucose, UA: NEGATIVE mg/dL
Hgb urine dipstick: NEGATIVE
Ketones, ur: 5 mg/dL — AB
Leukocytes,Ua: NEGATIVE
Nitrite: NEGATIVE
Protein, ur: NEGATIVE mg/dL
Specific Gravity, Urine: 1.031 — ABNORMAL HIGH (ref 1.005–1.030)
pH: 5 (ref 5.0–8.0)

## 2020-10-14 LAB — MAGNESIUM: Magnesium: 2.1 mg/dL (ref 1.7–2.4)

## 2020-10-14 LAB — COMPREHENSIVE METABOLIC PANEL
ALT: 26 U/L (ref 0–44)
AST: 52 U/L — ABNORMAL HIGH (ref 15–41)
Albumin: 3.3 g/dL — ABNORMAL LOW (ref 3.5–5.0)
Alkaline Phosphatase: 42 U/L (ref 38–126)
Anion gap: 7 (ref 5–15)
BUN: 25 mg/dL — ABNORMAL HIGH (ref 8–23)
CO2: 27 mmol/L (ref 22–32)
Calcium: 8.3 mg/dL — ABNORMAL LOW (ref 8.9–10.3)
Chloride: 103 mmol/L (ref 98–111)
Creatinine, Ser: 0.65 mg/dL (ref 0.61–1.24)
GFR, Estimated: >60 mL/min (ref >60)
Glucose, Bld: 104 mg/dL — ABNORMAL HIGH (ref 70–99)
Potassium: 3.6 mmol/L (ref 3.5–5.1)
Sodium: 137 mmol/L (ref 135–145)
Total Bilirubin: 1 mg/dL (ref 0.3–1.2)
Total Protein: 6.6 g/dL (ref 6.5–8.1)

## 2020-10-14 LAB — TSH: TSH: 1.299 u[IU]/mL (ref 0.350–4.500)

## 2020-10-14 LAB — URINE DRUG SCREEN, QUALITATIVE (ARMC ONLY)
Amphetamines, Ur Screen: NOT DETECTED
Barbiturates, Ur Screen: NOT DETECTED
Benzodiazepine, Ur Scrn: NOT DETECTED
Cannabinoid 50 Ng, Ur ~~LOC~~: NOT DETECTED
Cocaine Metabolite,Ur ~~LOC~~: NOT DETECTED
MDMA (Ecstasy)Ur Screen: NOT DETECTED
Methadone Scn, Ur: NOT DETECTED
Opiate, Ur Screen: NOT DETECTED
Phencyclidine (PCP) Ur S: NOT DETECTED
Tricyclic, Ur Screen: NOT DETECTED

## 2020-10-14 LAB — AMMONIA: Ammonia: 10 umol/L (ref 9–35)

## 2020-10-14 LAB — CK: Total CK: 756 U/L — ABNORMAL HIGH (ref 49–397)

## 2020-10-14 LAB — PROCALCITONIN: Procalcitonin: <0.1 ng/mL

## 2020-10-14 LAB — PHOSPHORUS: Phosphorus: 3 mg/dL (ref 2.5–4.6)

## 2020-10-14 MED ORDER — DIVALPROEX SODIUM 125 MG PO CSDR
250.0000 mg | DELAYED_RELEASE_CAPSULE | Freq: Two times a day (BID) | ORAL | Status: DC
  Administered 2020-10-15 – 2020-10-22 (×15): 250 mg via ORAL
  Filled 2020-10-14 (×20): qty 2

## 2020-10-14 MED ORDER — SERTRALINE HCL 50 MG PO TABS
100.0000 mg | ORAL_TABLET | Freq: Every day | ORAL | Status: DC
  Administered 2020-10-15 – 2020-10-21 (×7): 100 mg via ORAL
  Filled 2020-10-14 (×8): qty 2

## 2020-10-14 MED ORDER — ENOXAPARIN SODIUM 40 MG/0.4ML IJ SOSY
40.0000 mg | PREFILLED_SYRINGE | INTRAMUSCULAR | Status: DC
  Administered 2020-10-15 – 2020-10-23 (×9): 40 mg via SUBCUTANEOUS
  Filled 2020-10-14 (×9): qty 0.4

## 2020-10-14 MED ORDER — VITAMIN B-12 1000 MCG PO TABS
500.0000 ug | ORAL_TABLET | Freq: Every day | ORAL | Status: DC
  Administered 2020-10-15 – 2020-10-22 (×8): 500 ug via ORAL
  Filled 2020-10-14 (×8): qty 1

## 2020-10-14 MED ORDER — RISPERIDONE 1 MG PO TABS: 1.0000 mg | ORAL_TABLET | Freq: Two times a day (BID) | ORAL | Status: DC

## 2020-10-14 MED ORDER — LACTATED RINGERS IV SOLN
INTRAVENOUS | Status: AC
  Administered 2020-10-15: 75 mL/h via INTRAVENOUS

## 2020-10-14 MED ORDER — SERTRALINE HCL 50 MG PO TABS: 100.0000 mg | ORAL_TABLET | Freq: Every day | ORAL | Status: DC

## 2020-10-14 MED ORDER — RISPERIDONE 1 MG PO TABS
1.0000 mg | ORAL_TABLET | Freq: Two times a day (BID) | ORAL | Status: DC
  Administered 2020-10-15 – 2020-10-22 (×15): 1 mg via ORAL
  Filled 2020-10-14 (×20): qty 1

## 2020-10-14 MED ORDER — DIVALPROEX SODIUM 125 MG PO CSDR: 250.0000 mg | DELAYED_RELEASE_CAPSULE | Freq: Two times a day (BID) | ORAL | Status: DC

## 2020-10-14 MED ORDER — LACTATED RINGERS IV SOLN: INTRAVENOUS | Status: AC

## 2020-10-14 NOTE — Progress Notes (Signed)
PROGRESS NOTE    Duane Anderson  RUE:[id number] DOB: 13-Jun-1940 DOA: 10/13/2020 PCP: System, Provider Not In  143A/143A-AA   Assessment & Plan:   Principal Problem:   Acute encephalopathy Active Problems:   Elevated CPK   Acute prerenal azotemia   Leukocytosis   B12 deficiency   Dehydration   Duane Anderson is a 80 y.o. male with medical history significant for dementia, vitamin B12 deficiency, who is admitted to [facility] on 10/13/2020 with acute encephalopathy after presenting from SNF to [facility] ED for further evaluation of lethargy.    #) Acute encephalopathy:  --reportedly more lethargy relative to baseline dementia, the patient presents with 1 week of progressive lethargy, diminished responsiveness to verbal stimuli, diminished ambulatory activity, and inability to follow instructions.   --No overt evidence of underlying infectious process --CT head shows no evidence of acute intracranial process --Pt's multiple psych meds were held after recent ED visit, so withdrawal is in Ddx. Plan:  --resume home risperdal, zoloft and Depakote    #) Rhabdo --CK elevated at 965, trended down with IVF.  Possibly due to recent lethargy and bed-bound status. --cont LR@75     #) Dehydration:  Diagnosis on the basis of presenting labs reflecting prerenal azotemia without overt AKI, while physical exam is notable for the presence of dry oral mucosa membranes, while in the context of significant decline in oral intake over the last several days, as further detailed above.   Plan: --cont LR@75       #) Vitamin B12 deficiency:  --cont home vit B12 supplement   DVT prophylaxis: Lovenox SQ Code Status: DNR  Family Communication:  Level of care: Med-Surg Dispo:   The patient is from: SNF Anticipated d/c is to: SNF Anticipated d/c date is: 1-2 days Patient currently is not medically ready to d/c due to: continued lethargy, not having oral intake   Subjective and Interval  History:  Pt was lethargic overnight and most of the day, but was combative when approached.     Objective: Vitals:   10/14/20 0803 10/14/20 1214 10/14/20 1524 10/14/20 1946  BP: (!) 142/59 (!) 156/62 (!) 151/90 (!) 142/74  Pulse: 76 75 62 79  Resp: 15 15 15 18   Temp: 99.8 F (37.7 C) 98.3 F (36.8 C) 98.2 F (36.8 C) 98.9 F (37.2 C)  TempSrc:      SpO2: 95%  100% 100%  Weight:      Height:        Intake/Output Summary (Last 24 hours) at 10/14/2020 2236 Last data filed at 10/14/2020 1045 Gross per 24 hour  Intake --  Output 278 ml  Net -278 ml   Filed Weights   10/13/20 1522 10/14/20 0424  Weight: 85.3 kg 85.6 kg    Examination:   Constitutional: NAD, somnolent, not responding to questions HEENT: refused eye opening CV: No cyanosis.   RESP: normal respiratory effort, on RA Extremities: mild edema in BLE SKIN: warm, dry   Data Reviewed: I have personally reviewed following labs and imaging studies  CBC: Recent Labs  Lab 10/13/20 1546 10/14/20 0215  WBC 11.1* 11.0*  NEUTROABS 8.0* 8.2*  HGB 11.8* 11.3*  HCT 33.7* 34.2*  MCV 96.3 97.4  PLT 313 349   Basic Metabolic Panel: Recent Labs  Lab 10/13/20 1546 10/13/20 1646 10/14/20 0215  NA 139  --  137  K 3.7  --  3.6  CL 105  --  103  CO2 23  --  27  GLUCOSE 112*  --  104*  BUN 30*  --  25*  CREATININE 0.61  --  0.65  CALCIUM 8.7*  --  8.3*  MG  --  2.1 2.1  PHOS  --   --  3.0   GFR: Estimated Creatinine Clearance: 75.8 mL/min (by C-G formula based on SCr of 0.65 mg/dL). Liver Function Tests: Recent Labs  Lab 10/13/20 1546 10/14/20 0215  AST 51* 52*  ALT 27 26  ALKPHOS 41 42  BILITOT 0.8 1.0  PROT 6.6 6.6  ALBUMIN 3.3* 3.3*   No results for input(s): LIPASE, AMYLASE in the last 168 hours. Recent Labs  Lab 10/14/20 0200  AMMONIA 10   Coagulation Profile: No results for input(s): INR, PROTIME in the last 168 hours. Cardiac Enzymes: Recent Labs  Lab 10/13/20 1546 10/14/20 0215   CKTOTAL 965* 756*   BNP (last 3 results) No results for input(s): PROBNP in the last 8760 hours. HbA1C: No results for input(s): HGBA1C in the last 72 hours. CBG: No results for input(s): GLUCAP in the last 168 hours. Lipid Profile: No results for input(s): CHOL, HDL, LDLCALC, TRIG, CHOLHDL, LDLDIRECT in the last 72 hours. Thyroid Function Tests: Recent Labs    10/14/20 0215  TSH 1.299   Anemia Panel: No results for input(s): VITAMINB12, FOLATE, FERRITIN, TIBC, IRON, RETICCTPCT in the last 72 hours. Sepsis Labs: Recent Labs  Lab 10/14/20 0215  PROCALCITON <0.10    Recent Results (from the past 240 hour(s))  Resp Panel by RT-PCR (Flu A&B, Covid) Nasopharyngeal Swab     Status: None   Collection Time: 10/06/20  8:34 PM   Specimen: Nasopharyngeal Swab; Nasopharyngeal(NP) swabs in vial transport medium  Result Value Ref Range Status   SARS Coronavirus 2 by RT PCR NEGATIVE NEGATIVE Final    Comment: (NOTE) SARS-CoV-2 target nucleic acids are NOT DETECTED.  The SARS-CoV-2 RNA is generally detectable in upper respiratory specimens during the acute phase of infection. The lowest concentration of SARS-CoV-2 viral copies this assay can detect is 138 copies/mL. A negative result does not preclude SARS-Cov-2 infection and should not be used as the sole basis for treatment or other patient management decisions. A negative result may occur with  improper specimen collection/handling, submission of specimen other than nasopharyngeal swab, presence of viral mutation(s) within the areas targeted by this assay, and inadequate number of viral copies(<138 copies/mL). A negative result must be combined with clinical observations, patient history, and epidemiological information. The expected result is Negative.  Fact Sheet for Patients:  BloggerCourse.com  Fact Sheet for Healthcare Providers:  SeriousBroker.it  This test is no t yet  approved or cleared by the [place] FDA and  has been authorized for detection and/or diagnosis of SARS-CoV-2 by FDA under an Emergency Use Authorization (EUA). This EUA will remain  in effect (meaning this test can be used) for the duration of the COVID-19 declaration under Section 564(b)(1) of the Act, 21 U.S.C.section 360bbb-3(b)(1), unless the authorization is terminated  or revoked sooner.       Influenza A by PCR NEGATIVE NEGATIVE Final   Influenza B by PCR NEGATIVE NEGATIVE Final    Comment: (NOTE) The Xpert Xpress SARS-CoV-2/FLU/RSV plus assay is intended as an aid in the diagnosis of influenza from Nasopharyngeal swab specimens and should not be used as a sole basis for treatment. Nasal washings and aspirates are unacceptable for Xpert Xpress SARS-CoV-2/FLU/RSV testing.  Fact Sheet for Patients: BloggerCourse.com  Fact Sheet for Healthcare Providers: SeriousBroker.it  This test is not yet approved  or cleared by the [place] and has been authorized for detection and/or diagnosis of SARS-CoV-2 by FDA under an Emergency Use Authorization (EUA). This EUA will remain in effect (meaning this test can be used) for the duration of the COVID-19 declaration under Section 564(b)(1) of the Act, 21 U.S.C. section 360bbb-3(b)(1), unless the authorization is terminated or revoked.  Performed at [facility], [address] [place], [place] [address]   Resp Panel by RT-PCR (Flu A&B, Covid) Nasopharyngeal Swab     Status: None   Collection Time: 10/13/20  3:46 PM   Specimen: Nasopharyngeal Swab; Nasopharyngeal(NP) swabs in vial transport medium  Result Value Ref Range Status   SARS Coronavirus 2 by RT PCR NEGATIVE NEGATIVE Final    Comment: (NOTE) SARS-CoV-2 target nucleic acids are NOT DETECTED.  The SARS-CoV-2 RNA is generally detectable in upper respiratory specimens during the acute phase of infection.  The lowest concentration of SARS-CoV-2 viral copies this assay can detect is 138 copies/mL. A negative result does not preclude SARS-Cov-2 infection and should not be used as the sole basis for treatment or other patient management decisions. A negative result may occur with  improper specimen collection/handling, submission of specimen other than nasopharyngeal swab, presence of viral mutation(s) within the areas targeted by this assay, and inadequate number of viral copies(<138 copies/mL). A negative result must be combined with clinical observations, patient history, and epidemiological information. The expected result is Negative.  Fact Sheet for Patients:  BloggerCourse.com  Fact Sheet for Healthcare Providers:  SeriousBroker.it  This test is no t yet approved or cleared by the [place] FDA and  has been authorized for detection and/or diagnosis of SARS-CoV-2 by FDA under an Emergency Use Authorization (EUA). This EUA will remain  in effect (meaning this test can be used) for the duration of the COVID-19 declaration under Section 564(b)(1) of the Act, 21 U.S.C.section 360bbb-3(b)(1), unless the authorization is terminated  or revoked sooner.       Influenza A by PCR NEGATIVE NEGATIVE Final   Influenza B by PCR NEGATIVE NEGATIVE Final    Comment: (NOTE) The Xpert Xpress SARS-CoV-2/FLU/RSV plus assay is intended as an aid in the diagnosis of influenza from Nasopharyngeal swab specimens and should not be used as a sole basis for treatment. Nasal washings and aspirates are unacceptable for Xpert Xpress SARS-CoV-2/FLU/RSV testing.  Fact Sheet for Patients: BloggerCourse.com  Fact Sheet for Healthcare Providers: SeriousBroker.it  This test is not yet approved or cleared by the [place] FDA and has been authorized for detection and/or diagnosis of SARS-CoV-2 by FDA  under an Emergency Use Authorization (EUA). This EUA will remain in effect (meaning this test can be used) for the duration of the COVID-19 declaration under Section 564(b)(1) of the Act, 21 U.S.C. section 360bbb-3(b)(1), unless the authorization is terminated or revoked.  Performed at [facility], [address], [place], [place] [address]       Radiology Studies: CT HEAD WO CONTRAST ( )  Result Date: 10/13/2020 CLINICAL DATA:  Mental status change, unknown cause. Additional history provided: Patient not following commands, but responding to pain. EXAM: CT HEAD WITHOUT CONTRAST TECHNIQUE: Contiguous axial images were obtained from the base of the skull through the vertex without intravenous contrast. COMPARISON:  Head CT 10/06/2020. FINDINGS: Brain: Moderate cerebral atrophy, most prominent within the frontal and temporal lobes. Commensurate prominence of the ventricles and sulci. Mild patchy and ill-defined hypoattenuation within the cerebral white matter, nonspecific but compatible with chronic small  vessel ischemic disease. There is no acute intracranial hemorrhage. No demarcated cortical infarct. No extra-axial fluid collection. No evidence of an intracranial mass. No midline shift. Vascular: No hyperdense vessel.  Atherosclerotic calcifications. Skull: Normal. Negative for fracture or focal lesion. Sinuses/Orbits: Visualized orbits show no acute finding. Incompletely imaged left maxillary sinus mucous retention cyst measuring at least 19 mm. IMPRESSION: No evidence of acute intracranial abnormality. Moderate cerebral atrophy, most prominent within the frontal and temporal lobes. Mild chronic small-vessel ischemic changes within the cerebral white matter. Incompletely imaged left maxillary sinus mucous retention cyst. Electronically Signed   By: Jackey Loge DO   On: 10/13/2020 16:38   DG Chest Portable 1 View  Result Date: 10/13/2020 CLINICAL DATA:  Altered mental status.  Rule  out pneumonia. EXAM: PORTABLE CHEST 1 VIEW COMPARISON:  None. FINDINGS: Subtle patchy densities in the left perihilar region. Right lung is clear. Decreased lung volumes. Heart size is within normal limits. Negative for a pneumothorax. IMPRESSION: Low lung volumes with patchy densities in left perihilar region. Infection cannot be excluded. However, findings could be related to atelectasis or asymmetric vascular congestion/edema. Electronically Signed   By: Richarda Overlie M.D.   On: 10/13/2020 16:09     Scheduled Meds: Continuous Infusions:  lactated ringers 75 mL/hr at 10/14/20 1758     LOS: 1 day     Darlin Priestly, MD [facility] If 7PM-7AM, please contact night-coverage 10/14/2020, 10:36 PM

## 2020-10-14 NOTE — Progress Notes (Signed)
Patient woke up a little bit. Alert and oriented to self. This nurse gave patient a few bites of pudding and sips of water then would not take anymore. Patient tolerated well. Patient voided 100cc once today. This nurse bladder scanned patient at 1645. Bladder scan showed 106. Dr. Fran Lowes notified. No new orders at this time.

## 2020-10-14 NOTE — Progress Notes (Signed)
This nurse tried doing swallow eval on patient. Unable to do eval due to patient not waking up enough to follow directions. Dr. Fran Lowes in room during attempt. MD aware.

## 2020-10-15 LAB — MAGNESIUM: Magnesium: 2.1 mg/dL (ref 1.7–2.4)

## 2020-10-15 LAB — CALCIUM, IONIZED: Calcium, Ionized, Serum: 4.7 mg/dL (ref 4.5–5.6)

## 2020-10-15 LAB — CBC
HCT: 29.2 % — ABNORMAL LOW (ref 39.0–52.0)
Hemoglobin: 10 g/dL — ABNORMAL LOW (ref 13.0–17.0)
MCH: 33.1 pg (ref 26.0–34.0)
MCHC: 34.2 g/dL (ref 30.0–36.0)
MCV: 96.7 fL (ref 80.0–100.0)
Platelets: 311 10*3/uL (ref 150–400)
RBC: 3.02 MIL/uL — ABNORMAL LOW (ref 4.22–5.81)
RDW: 13.3 % (ref 11.5–15.5)
WBC: 8.5 10*3/uL (ref 4.0–10.5)
nRBC: 0 % (ref 0.0–0.2)

## 2020-10-15 LAB — BASIC METABOLIC PANEL
Anion gap: 7 (ref 5–15)
BUN: 19 mg/dL (ref 8–23)
CO2: 25 mmol/L (ref 22–32)
Calcium: 7.9 mg/dL — ABNORMAL LOW (ref 8.9–10.3)
Chloride: 105 mmol/L (ref 98–111)
Creatinine, Ser: 0.62 mg/dL (ref 0.61–1.24)
GFR, Estimated: >60 mL/min (ref >60)
Glucose, Bld: 87 mg/dL (ref 70–99)
Potassium: 3.5 mmol/L (ref 3.5–5.1)
Sodium: 137 mmol/L (ref 135–145)

## 2020-10-15 NOTE — Progress Notes (Signed)
PROGRESS NOTE    Duane Anderson  FUX:[id number] DOB: 25-Jul-1940 DOA: 10/13/2020 PCP: System, Provider Not In  143A/143A-AA   Assessment & Plan:   Principal Problem:   Acute encephalopathy Active Problems:   Elevated CPK   Acute prerenal azotemia   Leukocytosis   B12 deficiency   Dehydration   Duane Anderson is a 80 y.o. male with medical history significant for dementia, vitamin B12 deficiency, who is admitted to [facility] on 10/13/2020 with acute encephalopathy after presenting from SNF to [facility] ED for further evaluation of lethargy.    #) Acute encephalopathy:  --reportedly more lethargy relative to baseline dementia, the patient presents with 1 week of progressive lethargy, diminished responsiveness to verbal stimuli, diminished ambulatory activity, and inability to follow instructions.   --No overt evidence of underlying infectious process --CT head shows no evidence of acute intracranial process --Pt's multiple psych meds were held after recent ED visit, so withdrawal is in Ddx. Plan:  --cont home risperdal, zoloft and depakote   #) Rhabdo --CK elevated at 965, trended down with IVF.  Possibly due to recent lethargy and bed-bound status. --s/p IVF   #) Dehydration:  Diagnosis on the basis of presenting labs reflecting prerenal azotemia without overt AKI, while physical exam is notable for the presence of dry oral mucosa membranes, while in the context of significant decline in oral intake over the last several days, as further detailed above.   --s/p IVF --encourage oral hydration     #) Vitamin B12 deficiency:  --cont home vit B12 supplement   DVT prophylaxis: Lovenox SQ Code Status: DNR  Family Communication:  Level of care: Med-Surg Dispo:   The patient is from: SNF Anticipated d/c is to: SNF Anticipated d/c date is: 1-2 days Patient currently is not medically ready to d/c due to: continued lethargy, minimum oral intake   Subjective and  Interval History:  Pt was more alert this morning, ate some breakfast.  Also said more words.     Objective: Vitals:   10/14/20 2312 10/15/20 0419 10/15/20 0755 10/15/20 1100  BP: 104/77 132/65 (!) 153/77 (!) 132/96  Pulse: 91 68 76 72  Resp: 17 17 15 17   Temp: 98.8 F (37.1 C) 98.9 F (37.2 C) 98.3 F (36.8 C) 98.2 F (36.8 C)  TempSrc:      SpO2: 98% 95%  95%  Weight:      Height:        Intake/Output Summary (Last 24 hours) at 10/15/2020 1647 Last data filed at 10/15/2020 1025 Gross per 24 hour  Intake 120 ml  Output --  Net 120 ml   Filed Weights   10/13/20 1522 10/14/20 0424  Weight: 85.3 kg 85.6 kg    Examination:   Constitutional: NAD, keeping eyes closed, but answered a few questions CV: No cyanosis.   RESP: normal respiratory effort, on RA Extremities: No effusions, edema in BLE SKIN: warm, dry   Data Reviewed: I have personally reviewed following labs and imaging studies  CBC: Recent Labs  Lab 10/13/20 1546 10/14/20 0215 10/15/20 0518  WBC 11.1* 11.0* 8.5  NEUTROABS 8.0* 8.2*  --   HGB 11.8* 11.3* 10.0*  HCT 33.7* 34.2* 29.2*  MCV 96.3 97.4 96.7  PLT 313 349 311   Basic Metabolic Panel: Recent Labs  Lab 10/13/20 1546 10/13/20 1646 10/14/20 0215 10/15/20 0518  NA 139  --  137 137  K 3.7  --  3.6 3.5  CL 105  --  103  105  CO2 23  --  27 25  GLUCOSE 112*  --  104* 87  BUN 30*  --  25* 19  CREATININE 0.61  --  0.65 0.62  CALCIUM 8.7*  --  8.3* 7.9*  MG  --  2.1 2.1 2.1  PHOS  --   --  3.0  --    GFR: Estimated Creatinine Clearance: 75.8 mL/min (by C-G formula based on SCr of 0.62 mg/dL). Liver Function Tests: Recent Labs  Lab 10/13/20 1546 10/14/20 0215  AST 51* 52*  ALT 27 26  ALKPHOS 41 42  BILITOT 0.8 1.0  PROT 6.6 6.6  ALBUMIN 3.3* 3.3*   No results for input(s): LIPASE, AMYLASE in the last 168 hours. Recent Labs  Lab 10/14/20 0200  AMMONIA 10   Coagulation Profile: No results for input(s): INR, PROTIME in the last  168 hours. Cardiac Enzymes: Recent Labs  Lab 10/13/20 1546 10/14/20 0215  CKTOTAL 965* 756*   BNP (last 3 results) No results for input(s): PROBNP in the last 8760 hours. HbA1C: No results for input(s): HGBA1C in the last 72 hours. CBG: No results for input(s): GLUCAP in the last 168 hours. Lipid Profile: No results for input(s): CHOL, HDL, LDLCALC, TRIG, CHOLHDL, LDLDIRECT in the last 72 hours. Thyroid Function Tests: Recent Labs    10/14/20 0215  TSH 1.299   Anemia Panel: No results for input(s): VITAMINB12, FOLATE, FERRITIN, TIBC, IRON, RETICCTPCT in the last 72 hours. Sepsis Labs: Recent Labs  Lab 10/14/20 0215  PROCALCITON <0.10    Recent Results (from the past 240 hour(s))  Resp Panel by RT-PCR (Flu A&B, Covid) Nasopharyngeal Swab     Status: None   Collection Time: 10/06/20  8:34 PM   Specimen: Nasopharyngeal Swab; Nasopharyngeal(NP) swabs in vial transport medium  Result Value Ref Range Status   SARS Coronavirus 2 by RT PCR NEGATIVE NEGATIVE Final    Comment: (NOTE) SARS-CoV-2 target nucleic acids are NOT DETECTED.  The SARS-CoV-2 RNA is generally detectable in upper respiratory specimens during the acute phase of infection. The lowest concentration of SARS-CoV-2 viral copies this assay can detect is 138 copies/mL. A negative result does not preclude SARS-Cov-2 infection and should not be used as the sole basis for treatment or other patient management decisions. A negative result may occur with  improper specimen collection/handling, submission of specimen other than nasopharyngeal swab, presence of viral mutation(s) within the areas targeted by this assay, and inadequate number of viral copies(<138 copies/mL). A negative result must be combined with clinical observations, patient history, and epidemiological information. The expected result is Negative.  Fact Sheet for Patients:  BloggerCourse.com  Fact Sheet for Healthcare  Providers:  SeriousBroker.it  This test is no t yet approved or cleared by the [place] FDA and  has been authorized for detection and/or diagnosis of SARS-CoV-2 by FDA under an Emergency Use Authorization (EUA). This EUA will remain  in effect (meaning this test can be used) for the duration of the COVID-19 declaration under Section 564(b)(1) of the Act, 21 U.S.C.section 360bbb-3(b)(1), unless the authorization is terminated  or revoked sooner.       Influenza A by PCR NEGATIVE NEGATIVE Final   Influenza B by PCR NEGATIVE NEGATIVE Final    Comment: (NOTE) The Xpert Xpress SARS-CoV-2/FLU/RSV plus assay is intended as an aid in the diagnosis of influenza from Nasopharyngeal swab specimens and should not be used as a sole basis for treatment. Nasal washings and aspirates are unacceptable for Xpert  Xpress SARS-CoV-2/FLU/RSV testing.  Fact Sheet for Patients: BloggerCourse.com  Fact Sheet for Healthcare Providers: SeriousBroker.it  This test is not yet approved or cleared by the [place] FDA and has been authorized for detection and/or diagnosis of SARS-CoV-2 by FDA under an Emergency Use Authorization (EUA). This EUA will remain in effect (meaning this test can be used) for the duration of the COVID-19 declaration under Section 564(b)(1) of the Act, 21 U.S.C. section 360bbb-3(b)(1), unless the authorization is terminated or revoked.  Performed at [facility], [address] [place], [place] [address]   Resp Panel by RT-PCR (Flu A&B, Covid) Nasopharyngeal Swab     Status: None   Collection Time: 10/13/20  3:46 PM   Specimen: Nasopharyngeal Swab; Nasopharyngeal(NP) swabs in vial transport medium  Result Value Ref Range Status   SARS Coronavirus 2 by RT PCR NEGATIVE NEGATIVE Final    Comment: (NOTE) SARS-CoV-2 target nucleic acids are NOT DETECTED.  The SARS-CoV-2 RNA is generally  detectable in upper respiratory specimens during the acute phase of infection. The lowest concentration of SARS-CoV-2 viral copies this assay can detect is 138 copies/mL. A negative result does not preclude SARS-Cov-2 infection and should not be used as the sole basis for treatment or other patient management decisions. A negative result may occur with  improper specimen collection/handling, submission of specimen other than nasopharyngeal swab, presence of viral mutation(s) within the areas targeted by this assay, and inadequate number of viral copies(<138 copies/mL). A negative result must be combined with clinical observations, patient history, and epidemiological information. The expected result is Negative.  Fact Sheet for Patients:  BloggerCourse.com  Fact Sheet for Healthcare Providers:  SeriousBroker.it  This test is no t yet approved or cleared by the [place] FDA and  has been authorized for detection and/or diagnosis of SARS-CoV-2 by FDA under an Emergency Use Authorization (EUA). This EUA will remain  in effect (meaning this test can be used) for the duration of the COVID-19 declaration under Section 564(b)(1) of the Act, 21 U.S.C.section 360bbb-3(b)(1), unless the authorization is terminated  or revoked sooner.       Influenza A by PCR NEGATIVE NEGATIVE Final   Influenza B by PCR NEGATIVE NEGATIVE Final    Comment: (NOTE) The Xpert Xpress SARS-CoV-2/FLU/RSV plus assay is intended as an aid in the diagnosis of influenza from Nasopharyngeal swab specimens and should not be used as a sole basis for treatment. Nasal washings and aspirates are unacceptable for Xpert Xpress SARS-CoV-2/FLU/RSV testing.  Fact Sheet for Patients: BloggerCourse.com  Fact Sheet for Healthcare Providers: SeriousBroker.it  This test is not yet approved or cleared by the [place] FDA  and has been authorized for detection and/or diagnosis of SARS-CoV-2 by FDA under an Emergency Use Authorization (EUA). This EUA will remain in effect (meaning this test can be used) for the duration of the COVID-19 declaration under Section 564(b)(1) of the Act, 21 U.S.C. section 360bbb-3(b)(1), unless the authorization is terminated or revoked.  Performed at [facility], [address], [place], [place] [address]       Radiology Studies: No results found.   Scheduled Meds:  divalproex  250 mg Oral BID   enoxaparin (LOVENOX) injection  40 mg Subcutaneous Q24H   risperiDONE  1 mg Oral BID   sertraline  100 mg Oral QHS   vitamin B-12  500 mcg Oral Daily   Continuous Infusions:     LOS: 2 days     Darlin Priestly, MD [facility]  If 7PM-7AM, please contact night-coverage 10/15/2020, 4:47 PM

## 2020-10-16 LAB — BASIC METABOLIC PANEL
Anion gap: 4 — ABNORMAL LOW (ref 5–15)
BUN: 13 mg/dL (ref 8–23)
CO2: 26 mmol/L (ref 22–32)
Calcium: 7.9 mg/dL — ABNORMAL LOW (ref 8.9–10.3)
Chloride: 105 mmol/L (ref 98–111)
Creatinine, Ser: 0.53 mg/dL — ABNORMAL LOW (ref 0.61–1.24)
GFR, Estimated: >60 mL/min (ref >60)
Glucose, Bld: 102 mg/dL — ABNORMAL HIGH (ref 70–99)
Potassium: 3.6 mmol/L (ref 3.5–5.1)
Sodium: 135 mmol/L (ref 135–145)

## 2020-10-16 LAB — CBC
HCT: 29.2 % — ABNORMAL LOW (ref 39.0–52.0)
Hemoglobin: 10.1 g/dL — ABNORMAL LOW (ref 13.0–17.0)
MCH: 33.2 pg (ref 26.0–34.0)
MCHC: 34.6 g/dL (ref 30.0–36.0)
MCV: 96.1 fL (ref 80.0–100.0)
Platelets: 317 10*3/uL (ref 150–400)
RBC: 3.04 MIL/uL — ABNORMAL LOW (ref 4.22–5.81)
RDW: 13.2 % (ref 11.5–15.5)
WBC: 8 10*3/uL (ref 4.0–10.5)
nRBC: 0 % (ref 0.0–0.2)

## 2020-10-16 LAB — MAGNESIUM: Magnesium: 2.1 mg/dL (ref 1.7–2.4)

## 2020-10-16 NOTE — Plan of Care (Signed)

## 2020-10-16 NOTE — Progress Notes (Signed)
CSW spoke with [facility] at [phone]. They report having concerns with patient coming back, and believe he may need SNF level of care. They report at baseline he was walking until recently, and now at facility has been max assist with 4 person transfers. They report their ALF cannot accommodate that higher level of care, if patient still needs that.   MD updated and PT to assess for appropriate discharge planning.   [place], [place] [phone]

## 2020-10-16 NOTE — Evaluation (Signed)
Physical Therapy Evaluation Patient Details Name: Duane Anderson MRN: [id number] DOB: 1940-04-11 Today's Date: 10/16/2020   History of Present Illness  80 y.o. male presenting from SNF with acute encepalopathy and evaluation of lethary progressing over the past week with decreased activity levels and decreased response to stimuli. PMH significant for dementia, vitamin B12 deficiency.  Clinical Impression  Pt received supine in bed, agreeable to therapy. Pt was A&O x 0 upon arrival, although he did respond to his name. Command following was significantly impaired due to baseline dementia. Strength was not assessed due to inability to follow MMT cues and decreased attention span. He did respond better to functional cues like "sit up to the egde of the bed" however decreased attention span limited this actility and pt ended up requiring total assist to complete. He remained distracted by the gait belt in PT pocket; while sitting EOB he pulled belt out of pocket and attention remained fixated on belt until PT left pt room. Per baseline reported by SNF, pt was ambulating with SBA 1 week ago. Will attempt further mobility if pt is able to follow commands. Would benefit from skilled PT to address above deficits and promote optimal return to PLOF.     Follow Up Recommendations SNF (could benefit from memory care for pt safety)    Equipment Recommendations  None recommended by PT (TBD at next venue of care)    Recommendations for Other Services       Precautions / Restrictions Precautions Precautions: Fall Restrictions Weight Bearing Restrictions: No      Mobility  Bed Mobility Overal bed mobility: Needs Assistance Bed Mobility: Supine to Sit;Sit to Supine     Supine to sit: Total assist;HOB elevated Sit to supine: Total assist   General bed mobility comments: PT managed BLE and lifted trunk to upright. Pt did not assist with movement as his attention was fixated on gait belt in PT pocket. He  did prop on R elbow in order to pull gait belt out of pocket. NA was called in to assist with repositioning pt in bed.    Transfers                 General transfer comment: deferred  Ambulation/Gait             General Gait Details: deferred  Stairs            Wheelchair Mobility    Modified Rankin (Stroke Patients Only)       Balance Overall balance assessment: Needs assistance   Sitting balance-Leahy Scale: Poor Sitting balance - Comments: able to prop on R elbow, otherwise PT provided total assist to lift trunk to upright. Pt was unable to follow commands to maintain the position. Postural control: Right lateral lean     Standing balance comment: deferred                             Pertinent Vitals/Pain Pain Assessment: No/denies pain    Home Living Family/patient expects to be discharged to:: Skilled nursing facility                      Prior Function Level of Independence: Needs assistance   Gait / Transfers Assistance Needed: SBA with gait per SNF           Hand Dominance        Extremity/Trunk Assessment   Upper Extremity Assessment Upper Extremity Assessment:  Difficult to assess due to impaired cognition    Lower Extremity Assessment Lower Extremity Assessment: Difficult to assess due to impaired cognition (Unable to follow commands for MMT or functional movements)       Communication   Communication: Other (comment) (dementia related - limited command following, pt mumbled, difficult to understand)  Cognition Arousal/Alertness: Awake/alert Behavior During Therapy: Impulsive;Restless Overall Cognitive Status: History of cognitive impairments - at baseline                                 General Comments: A&O x 0; unable to gain history, pt unable to follow commands      General Comments      Exercises     Assessment/Plan    PT Assessment Patient needs continued PT services   PT Problem List Decreased strength;Decreased cognition;Decreased range of motion;Decreased activity tolerance;Decreased safety awareness;Decreased balance;Decreased mobility       PT Treatment Interventions DME instruction;Balance training;Modalities;Gait training;Neuromuscular re-education;Functional mobility training;Patient/family education;Therapeutic activities;Therapeutic exercise    PT Goals (Current goals can be found in the Care Plan section)  Acute Rehab PT Goals Patient Stated Goal: none stated PT Goal Formulation: With patient    Frequency Min 2X/week   Barriers to discharge        Co-evaluation               AM-PAC PT "6 Clicks" Mobility  Outcome Measure Help needed turning from your back to your side while in a flat bed without using bedrails?: Total Help needed moving from lying on your back to sitting on the side of a flat bed without using bedrails?: Total Help needed moving to and from a bed to a chair (including a wheelchair)?: Total Help needed standing up from a chair using your arms (e.g., wheelchair or bedside chair)?: Total Help needed to walk in hospital room?: Total Help needed climbing 3-5 steps with a railing? : Total 6 Click Score: 6    End of Session   Activity Tolerance: Other (comment) (limited due to cognitive impairment and participation) Patient left: in bed;with call bell/phone within reach;with bed alarm set Nurse Communication: Mobility status PT Visit Diagnosis: Unsteadiness on feet (R26.81);Other abnormalities of gait and mobility (R26.89);Muscle weakness (generalized) (M62.81);Difficulty in walking, not elsewhere classified (R26.2)    Time: 1638-4665 PT Time Calculation (min) (ACUTE ONLY): 19 min   Charges:   PT Evaluation $PT Eval Low Complexity: 1 Low PT Treatments $Therapeutic Activity: 8-22 mins        Basilia Jumbo PT, DPT 10/16/20 6:05 PM [phone]   [name] 10/16/2020, 6:00 PM

## 2020-10-16 NOTE — Progress Notes (Signed)
PROGRESS NOTE    Duane Anderson  JGO:[id number] DOB: 1941/03/03 DOA: 10/13/2020 PCP: System, Provider Not In  143A/143A-AA   Assessment & Plan:   Principal Problem:   Acute encephalopathy Active Problems:   Elevated CPK   Acute prerenal azotemia   Leukocytosis   B12 deficiency   Dehydration   Duane Anderson is a 80 y.o. male with medical history significant for dementia, vitamin B12 deficiency, who is admitted to [facility] on 10/13/2020 with acute encephalopathy after presenting from SNF to [facility] ED for further evaluation of lethargy.    #) Acute encephalopathy, improved --reportedly more lethargy relative to baseline dementia, the patient presents with 1 week of progressive lethargy, diminished responsiveness to verbal stimuli, diminished ambulatory activity, and inability to follow instructions.   --No overt evidence of underlying infectious process --CT head shows no evidence of acute intracranial process --Pt's multiple psych meds were held after recent ED visit, so withdrawal is in Ddx.  Home psych med resumed. Plan:  --cont home risperdal, zoloft and depakote   #) Rhabdo --CK elevated at 965, trended down with IVF.  Possibly due to recent lethargy and bed-bound status.   #) Dehydration:  Diagnosis on the basis of presenting labs reflecting prerenal azotemia without overt AKI, while physical exam is notable for the presence of dry oral mucosa membranes, while in the context of significant decline in oral intake over the last several days, as further detailed above.   --s/p IVF --encourage oral hydration     #) Vitamin B12 deficiency:  --cont home vit B12 supplement   DVT prophylaxis: Lovenox SQ Code Status: DNR  Family Communication:  Level of care: Med-Surg Dispo:   The patient is from: ALF Anticipated d/c is to: SNF Anticipated d/c date is: undetermined Patient currently is medically ready to d/c.   Subjective and Interval History:  More alert  and talkative today, though incomprehensible.    PT worked with pt, but pt couldn't follow commands and couldn't get out of bed.   Objective: Vitals:   10/15/20 2351 10/16/20 0511 10/16/20 0740 10/16/20 1156  BP: 115/60 116/73 128/67 117/61  Pulse: 79 (!) 54 72 71  Resp: 17 17 15 15   Temp: 98.5 F (36.9 C) 98.4 F (36.9 C) 99 F (37.2 C) (!) 97.4 F (36.3 C)  TempSrc:      SpO2: 95% 96% 96% 97%  Weight:      Height:       No intake or output data in the 24 hours ending 10/16/20 1542  Filed Weights   10/13/20 1522 10/14/20 0424  Weight: 85.3 kg 85.6 kg    Examination:   Constitutional: NAD, alert, smiling and talking, though incomprehensible  HEENT: conjunctivae and lids normal, EOMI CV: No cyanosis.   RESP: normal respiratory effort, on RA Extremities: No effusions, edema in BLE SKIN: warm, dry Neuro: II - XII grossly intact.     Data Reviewed: I have personally reviewed following labs and imaging studies  CBC: Recent Labs  Lab 10/13/20 1546 10/14/20 0215 10/15/20 0518 10/16/20 0522  WBC 11.1* 11.0* 8.5 8.0  NEUTROABS 8.0* 8.2*  --   --   HGB 11.8* 11.3* 10.0* 10.1*  HCT 33.7* 34.2* 29.2* 29.2*  MCV 96.3 97.4 96.7 96.1  PLT 313 349 311 317   Basic Metabolic Panel: Recent Labs  Lab 10/13/20 1546 10/13/20 1646 10/14/20 0215 10/15/20 0518 10/16/20 0522  NA 139  --  137 137 135  K 3.7  --  3.6 3.5 3.6  CL 105  --  103 105 105  CO2 23  --  27 25 26   GLUCOSE 112*  --  104* 87 102*  BUN 30*  --  25* 19 13  CREATININE 0.61  --  0.65 0.62 0.53*  CALCIUM 8.7*  --  8.3* 7.9* 7.9*  MG  --  2.1 2.1 2.1 2.1  PHOS  --   --  3.0  --   --    GFR: Estimated Creatinine Clearance: 75.8 mL/min (A) (by C-G formula based on SCr of 0.53 mg/dL (L)). Liver Function Tests: Recent Labs  Lab 10/13/20 1546 10/14/20 0215  AST 51* 52*  ALT 27 26  ALKPHOS 41 42  BILITOT 0.8 1.0  PROT 6.6 6.6  ALBUMIN 3.3* 3.3*   No results for input(s): LIPASE, AMYLASE in the  last 168 hours. Recent Labs  Lab 10/14/20 0200  AMMONIA 10   Coagulation Profile: No results for input(s): INR, PROTIME in the last 168 hours. Cardiac Enzymes: Recent Labs  Lab 10/13/20 1546 10/14/20 0215  CKTOTAL 965* 756*   BNP (last 3 results) No results for input(s): PROBNP in the last 8760 hours. HbA1C: No results for input(s): HGBA1C in the last 72 hours. CBG: No results for input(s): GLUCAP in the last 168 hours. Lipid Profile: No results for input(s): CHOL, HDL, LDLCALC, TRIG, CHOLHDL, LDLDIRECT in the last 72 hours. Thyroid Function Tests: Recent Labs    10/14/20 0215  TSH 1.299   Anemia Panel: No results for input(s): VITAMINB12, FOLATE, FERRITIN, TIBC, IRON, RETICCTPCT in the last 72 hours. Sepsis Labs: Recent Labs  Lab 10/14/20 0215  PROCALCITON <0.10    Recent Results (from the past 240 hour(s))  Resp Panel by RT-PCR (Flu A&B, Covid) Nasopharyngeal Swab     Status: None   Collection Time: 10/06/20  8:34 PM   Specimen: Nasopharyngeal Swab; Nasopharyngeal(NP) swabs in vial transport medium  Result Value Ref Range Status   SARS Coronavirus 2 by RT PCR NEGATIVE NEGATIVE Final    Comment: (NOTE) SARS-CoV-2 target nucleic acids are NOT DETECTED.  The SARS-CoV-2 RNA is generally detectable in upper respiratory specimens during the acute phase of infection. The lowest concentration of SARS-CoV-2 viral copies this assay can detect is 138 copies/mL. A negative result does not preclude SARS-Cov-2 infection and should not be used as the sole basis for treatment or other patient management decisions. A negative result may occur with  improper specimen collection/handling, submission of specimen other than nasopharyngeal swab, presence of viral mutation(s) within the areas targeted by this assay, and inadequate number of viral copies(<138 copies/mL). A negative result must be combined with clinical observations, patient history, and  epidemiological information. The expected result is Negative.  Fact Sheet for Patients:  BloggerCourse.comhttps://www.fda.gov/media/152166/download  Fact Sheet for Healthcare Providers:  SeriousBroker.ithttps://www.fda.gov/media/152162/download  This test is no t yet approved or cleared by the [place] FDA and  has been authorized for detection and/or diagnosis of SARS-CoV-2 by FDA under an Emergency Use Authorization (EUA). This EUA will remain  in effect (meaning this test can be used) for the duration of the COVID-19 declaration under Section 564(b)(1) of the Act, 21 U.S.C.section 360bbb-3(b)(1), unless the authorization is terminated  or revoked sooner.       Influenza A by PCR NEGATIVE NEGATIVE Final   Influenza B by PCR NEGATIVE NEGATIVE Final    Comment: (NOTE) The Xpert Xpress SARS-CoV-2/FLU/RSV plus assay is intended as an aid in the diagnosis of influenza from  Nasopharyngeal swab specimens and should not be used as a sole basis for treatment. Nasal washings and aspirates are unacceptable for Xpert Xpress SARS-CoV-2/FLU/RSV testing.  Fact Sheet for Patients: BloggerCourse.com  Fact Sheet for Healthcare Providers: SeriousBroker.it  This test is not yet approved or cleared by the [place] FDA and has been authorized for detection and/or diagnosis of SARS-CoV-2 by FDA under an Emergency Use Authorization (EUA). This EUA will remain in effect (meaning this test can be used) for the duration of the COVID-19 declaration under Section 564(b)(1) of the Act, 21 U.S.C. section 360bbb-3(b)(1), unless the authorization is terminated or revoked.  Performed at [facility], [address] [place], [place] [address]   Resp Panel by RT-PCR (Flu A&B, Covid) Nasopharyngeal Swab     Status: None   Collection Time: 10/13/20  3:46 PM   Specimen: Nasopharyngeal Swab; Nasopharyngeal(NP) swabs in vial transport medium  Result Value Ref Range  Status   SARS Coronavirus 2 by RT PCR NEGATIVE NEGATIVE Final    Comment: (NOTE) SARS-CoV-2 target nucleic acids are NOT DETECTED.  The SARS-CoV-2 RNA is generally detectable in upper respiratory specimens during the acute phase of infection. The lowest concentration of SARS-CoV-2 viral copies this assay can detect is 138 copies/mL. A negative result does not preclude SARS-Cov-2 infection and should not be used as the sole basis for treatment or other patient management decisions. A negative result may occur with  improper specimen collection/handling, submission of specimen other than nasopharyngeal swab, presence of viral mutation(s) within the areas targeted by this assay, and inadequate number of viral copies(<138 copies/mL). A negative result must be combined with clinical observations, patient history, and epidemiological information. The expected result is Negative.  Fact Sheet for Patients:  BloggerCourse.com  Fact Sheet for Healthcare Providers:  SeriousBroker.it  This test is no t yet approved or cleared by the [place] FDA and  has been authorized for detection and/or diagnosis of SARS-CoV-2 by FDA under an Emergency Use Authorization (EUA). This EUA will remain  in effect (meaning this test can be used) for the duration of the COVID-19 declaration under Section 564(b)(1) of the Act, 21 U.S.C.section 360bbb-3(b)(1), unless the authorization is terminated  or revoked sooner.       Influenza A by PCR NEGATIVE NEGATIVE Final   Influenza B by PCR NEGATIVE NEGATIVE Final    Comment: (NOTE) The Xpert Xpress SARS-CoV-2/FLU/RSV plus assay is intended as an aid in the diagnosis of influenza from Nasopharyngeal swab specimens and should not be used as a sole basis for treatment. Nasal washings and aspirates are unacceptable for Xpert Xpress SARS-CoV-2/FLU/RSV testing.  Fact Sheet for  Patients: BloggerCourse.com  Fact Sheet for Healthcare Providers: SeriousBroker.it  This test is not yet approved or cleared by the [place] FDA and has been authorized for detection and/or diagnosis of SARS-CoV-2 by FDA under an Emergency Use Authorization (EUA). This EUA will remain in effect (meaning this test can be used) for the duration of the COVID-19 declaration under Section 564(b)(1) of the Act, 21 U.S.C. section 360bbb-3(b)(1), unless the authorization is terminated or revoked.  Performed at [facility], [address] [place], [place] [address]       Radiology Studies: No results found.   Scheduled Meds:  divalproex  250 mg Oral BID   enoxaparin (LOVENOX) injection  40 mg Subcutaneous Q24H   risperiDONE  1 mg Oral BID   sertraline  100 mg Oral QHS   vitamin B-12  500 mcg  Oral Daily   Continuous Infusions:     LOS: 3 days     Darlin Priestly, MD [facility] If 7PM-7AM, please contact night-coverage 10/16/2020, 3:42 PM

## 2020-10-17 LAB — CBC
HCT: 30.4 % — ABNORMAL LOW (ref 39.0–52.0)
Hemoglobin: 10.4 g/dL — ABNORMAL LOW (ref 13.0–17.0)
MCH: 32.5 pg (ref 26.0–34.0)
MCHC: 34.2 g/dL (ref 30.0–36.0)
MCV: 95 fL (ref 80.0–100.0)
Platelets: 370 10*3/uL (ref 150–400)
RBC: 3.2 MIL/uL — ABNORMAL LOW (ref 4.22–5.81)
RDW: 13 % (ref 11.5–15.5)
WBC: 7.5 10*3/uL (ref 4.0–10.5)
nRBC: 0 % (ref 0.0–0.2)

## 2020-10-17 LAB — BASIC METABOLIC PANEL
Anion gap: 6 (ref 5–15)
BUN: 9 mg/dL (ref 8–23)
CO2: 25 mmol/L (ref 22–32)
Calcium: 7.8 mg/dL — ABNORMAL LOW (ref 8.9–10.3)
Chloride: 107 mmol/L (ref 98–111)
Creatinine, Ser: 0.57 mg/dL — ABNORMAL LOW (ref 0.61–1.24)
GFR, Estimated: >60 mL/min (ref >60)
Glucose, Bld: 101 mg/dL — ABNORMAL HIGH (ref 70–99)
Potassium: 3.5 mmol/L (ref 3.5–5.1)
Sodium: 138 mmol/L (ref 135–145)

## 2020-10-17 LAB — MAGNESIUM: Magnesium: 2.4 mg/dL (ref 1.7–2.4)

## 2020-10-17 NOTE — TOC Initial Note (Addendum)
Transition of Care [facility]) - Initial/Assessment Note    Patient Details  Name: Duane Anderson MRN: [id number] Date of Birth: 1941/01/04  [facility]) CM/SW Contact:    Gildardo Griffes, LCSW Phone Number: 10/17/2020, 10:45 AM  Clinical Narrative:                  Update: patient son called CSW back and agreeable to plan for snf, patient clinicals have been sent out to facilities however patient continues to await a bed offer at this time.     CSW notes patient is from [facility], facility has expressed concerns with patient returning due to some decline in mobility. PT to re assess if patient can return to [facility] or need SNF.   CSW has completed SNF referral process pending bed offers.   CSW has lvm with son Arlys John to discuss discharge planning options. Pending call back at this time. Should patient not show improvement in mobility, [facility] will not accept back until patient has gone to SNF for rehab.   TOC will continue to follow for discharge planning.    Expected Discharge Plan: Skilled Nursing Facility Barriers to Discharge: Continued Medical Work up   Patient Goals and CMS Choice   CMS Medicare.gov Compare Post Acute Care list provided to:: Patient Represenative (must comment) (son Arlys John) Choice offered to / list presented to : Adult Children  Expected Discharge Plan and Services Expected Discharge Plan: Skilled Nursing Facility       Living arrangements for the past 2 months: Assisted Living Facility [facility] commercial)                                      Prior Living Arrangements/Services Living arrangements for the past 2 months: Assisted Living Facility [facility], commercial) Lives with:: Facility Resident                   Activities of Daily Living      Permission Sought/Granted                  Emotional Assessment       Orientation: :  (disoriented x4) Alcohol / Substance Use: Not Applicable Psych Involvement: No  (comment)  Admission diagnosis:  Acute encephalopathy [G93.40] Altered mental status, unspecified altered mental status type [R41.82] Patient Active Problem List   Diagnosis Date Noted   Elevated CPK 10/14/2020   Acute prerenal azotemia 10/14/2020   Leukocytosis 10/14/2020   B12 deficiency 10/14/2020   Dehydration 10/14/2020   Acute encephalopathy 10/13/2020   PCP:  System, Provider Not In Pharmacy:  No Pharmacies Listed    Social Determinants of Health (SDOH) Interventions    Readmission Risk Interventions No flowsheet data found.

## 2020-10-17 NOTE — NC FL2 (Signed)
  [place] MEDICAID FL2 LEVEL OF CARE SCREENING TOOL     IDENTIFICATION  Patient Name: Duane Anderson Birthdate: 1940/05/12 Sex: male Admission Date (Current Location): 10/13/2020  [facility] and [place] Number:  Chiropodist and Address:  [facility], [address] [place], [place] [address]      Provider Number: [id number]  Attending Physician Name and Address:  Darlin Priestly, MD  Relative Name and Phone Number:  Arlys John (son) [phone]    Current Level of Care: Hospital Recommended Level of Care: Skilled Nursing Facility Prior Approval Number:    Date Approved/Denied:   PASRR Number: [id number]  Discharge Plan: SNF    Current Diagnoses: Patient Active Problem List   Diagnosis Date Noted   Elevated CPK 10/14/2020   Acute prerenal azotemia 10/14/2020   Leukocytosis 10/14/2020   B12 deficiency 10/14/2020   Dehydration 10/14/2020   Acute encephalopathy 10/13/2020    Orientation RESPIRATION BLADDER Height & Weight      (disoriented x4)  Normal Incontinent, External catheter Weight: 188 lb 11.4 oz (85.6 kg) Height:  5\' 8"  (172.7 cm)  BEHAVIORAL SYMPTOMS/MOOD NEUROLOGICAL BOWEL NUTRITION STATUS      Incontinent Diet (see discharge summary)  AMBULATORY STATUS COMMUNICATION OF NEEDS Skin   Extensive Assist Verbally Normal                       Personal Care Assistance Level of Assistance  Bathing, Feeding, Dressing, Total care Bathing Assistance: Maximum assistance Feeding assistance: Limited assistance Dressing Assistance: Maximum assistance Total Care Assistance: Maximum assistance   Functional Limitations Info  Sight, Hearing, Speech Sight Info: Adequate Hearing Info: Adequate Speech Info: Adequate    SPECIAL CARE FACTORS FREQUENCY  PT (By licensed PT), OT (By licensed OT)     PT Frequency: min 4x weekly OT Frequency: min 4x weekly            Contractures Contractures Info: Not present    Additional Factors Info   Code Status, Allergies Code Status Info: DNR Allergies Info: NO Known Allergies           Current Medications (10/17/2020):  This is the current hospital active medication list Current Facility-Administered Medications  Medication Dose Route Frequency Provider Last Rate Last Admin   acetaminophen (TYLENOL) tablet 650 mg  650 mg Oral Q6H PRN Howerter, Justin B, DO   650 mg at 10/16/20 2154   Or   acetaminophen (TYLENOL) suppository 650 mg  650 mg Rectal Q6H PRN Howerter, Justin B, DO       divalproex (DEPAKOTE SPRINKLE) capsule 250 mg  250 mg Oral BID 2155, MD   250 mg at 10/17/20 0932   enoxaparin (LOVENOX) injection 40 mg  40 mg Subcutaneous Q24H 12/17/20, MD   40 mg at 10/17/20 0931   risperiDONE (RISPERDAL) tablet 1 mg  1 mg Oral BID 12/17/20, MD   1 mg at 10/17/20 0933   sertraline (ZOLOFT) tablet 100 mg  100 mg Oral QHS 12/17/20, MD   100 mg at 10/16/20 2154   vitamin B-12 (CYANOCOBALAMIN) tablet 500 mcg  500 mcg Oral Daily 2155, MD   500 mcg at 10/17/20 12/17/20     Discharge Medications: Please see discharge summary for a list of discharge medications.  Relevant Imaging Results:  Relevant Lab Results:   Additional Information SSN:[id number]  11-28-1999, LCSW

## 2020-10-17 NOTE — Progress Notes (Signed)
PROGRESS NOTE    Duane Anderson  QJJ:[id number] DOB: [birth year] DOA: 10/13/2020 PCP: System, Provider Not In  143A/143A-AA   Assessment & Plan:   Principal Problem:   Acute encephalopathy Active Problems:   Elevated CPK   Acute prerenal azotemia   Leukocytosis   B12 deficiency   Dehydration   Duane Anderson is a 80 y.o. male with medical history significant for dementia, vitamin B12 deficiency, who is admitted to [facility] on 10/13/2020 with acute encephalopathy after presenting from SNF to [facility] ED for further evaluation of lethargy.    #) Acute encephalopathy, improved --reportedly more lethargy relative to baseline dementia, the patient presents with 1 week of progressive lethargy, diminished responsiveness to verbal stimuli, diminished ambulatory activity, and inability to follow instructions.   --No overt evidence of underlying infectious process --CT head shows no evidence of acute intracranial process --Pt's multiple psych meds were held after recent ED visit, so withdrawal is in Ddx.  Home psych med resumed. Plan:  --cont home risperdal, zoloft and depakote   #) Rhabdo --CK elevated at 965, trended down with IVF.  Possibly due to recent lethargy and bed-bound status.   #) Dehydration:  Diagnosis on the basis of presenting labs reflecting prerenal azotemia without overt AKI, while physical exam is notable for the presence of dry oral mucosa membranes, while in the context of significant decline in oral intake over the last several days, as further detailed above.   --s/p IVF --encourage oral hydration     #) Vitamin B12 deficiency:  --cont home vit B12 supplement   DVT prophylaxis: Lovenox SQ Code Status: DNR  Family Communication:  Level of care: Med-Surg Dispo:   The patient is from: ALF Anticipated d/c is to: SNF Anticipated d/c date is: whenever bed available. Patient currently is medically ready to d/c.   Subjective and Interval History:   Pt was less interactive today.     Objective: Vitals:   10/17/20 0536 10/17/20 0815 10/17/20 1242 10/17/20 1550  BP: 122/67 119/72 127/81 120/68  Pulse: 70 79 80 76  Resp: 16 18 18 15   Temp: 99 F (37.2 C) 98.8 F (37.1 C) 98.8 F (37.1 C) 98.5 F (36.9 C)  TempSrc:   Oral   SpO2: 93% 97% 96% 95%  Weight:      Height:       No intake or output data in the 24 hours ending 10/17/20 1823  Filed Weights   10/13/20 1522 10/14/20 0424  Weight: 85.3 kg 85.6 kg    Examination:   Constitutional: NAD, eyes closed, resisting eye opening CV: No cyanosis.   RESP: normal respiratory effort, on RA Extremities: No effusions, edema in BLE SKIN: warm, dry   Data Reviewed: I have personally reviewed following labs and imaging studies  CBC: Recent Labs  Lab 10/13/20 1546 10/14/20 0215 10/15/20 0518 10/16/20 0522 10/17/20 0448  WBC 11.1* 11.0* 8.5 8.0 7.5  NEUTROABS 8.0* 8.2*  --   --   --   HGB 11.8* 11.3* 10.0* 10.1* 10.4*  HCT 33.7* 34.2* 29.2* 29.2* 30.4*  MCV 96.3 97.4 96.7 96.1 95.0  PLT 313 349 311 317 370   Basic Metabolic Panel: Recent Labs  Lab 10/13/20 1546 10/13/20 1646 10/14/20 0215 10/15/20 0518 10/16/20 0522 10/17/20 0448  NA 139  --  137 137 135 138  K 3.7  --  3.6 3.5 3.6 3.5  CL 105  --  103 105 105 107  CO2 23  --  27 25 26 25   GLUCOSE 112*  --  104* 87 102* 101*  BUN 30*  --  25* 19 13 9   CREATININE 0.61  --  0.65 0.62 0.53* 0.57*  CALCIUM 8.7*  --  8.3* 7.9* 7.9* 7.8*  MG  --  2.1 2.1 2.1 2.1 2.4  PHOS  --   --  3.0  --   --   --    GFR: Estimated Creatinine Clearance: 75.8 mL/min (A) (by C-G formula based on SCr of 0.57 mg/dL (L)). Liver Function Tests: Recent Labs  Lab 10/13/20 1546 10/14/20 0215  AST 51* 52*  ALT 27 26  ALKPHOS 41 42  BILITOT 0.8 1.0  PROT 6.6 6.6  ALBUMIN 3.3* 3.3*   No results for input(s): LIPASE, AMYLASE in the last 168 hours. Recent Labs  Lab 10/14/20 0200  AMMONIA 10   Coagulation Profile: No  results for input(s): INR, PROTIME in the last 168 hours. Cardiac Enzymes: Recent Labs  Lab 10/13/20 1546 10/14/20 0215  CKTOTAL 965* 756*   BNP (last 3 results) No results for input(s): PROBNP in the last 8760 hours. HbA1C: No results for input(s): HGBA1C in the last 72 hours. CBG: No results for input(s): GLUCAP in the last 168 hours. Lipid Profile: No results for input(s): CHOL, HDL, LDLCALC, TRIG, CHOLHDL, LDLDIRECT in the last 72 hours. Thyroid Function Tests: No results for input(s): TSH, T4TOTAL, FREET4, T3FREE, THYROIDAB in the last 72 hours.  Anemia Panel: No results for input(s): VITAMINB12, FOLATE, FERRITIN, TIBC, IRON, RETICCTPCT in the last 72 hours. Sepsis Labs: Recent Labs  Lab 10/14/20 0215  PROCALCITON <0.10    Recent Results (from the past 240 hour(s))  Resp Panel by RT-PCR (Flu A&B, Covid) Nasopharyngeal Swab     Status: None   Collection Time: 10/13/20  3:46 PM   Specimen: Nasopharyngeal Swab; Nasopharyngeal(NP) swabs in vial transport medium  Result Value Ref Range Status   SARS Coronavirus 2 by RT PCR NEGATIVE NEGATIVE Final    Comment: (NOTE) SARS-CoV-2 target nucleic acids are NOT DETECTED.  The SARS-CoV-2 RNA is generally detectable in upper respiratory specimens during the acute phase of infection. The lowest concentration of SARS-CoV-2 viral copies this assay can detect is 138 copies/mL. A negative result does not preclude SARS-Cov-2 infection and should not be used as the sole basis for treatment or other patient management decisions. A negative result may occur with  improper specimen collection/handling, submission of specimen other than nasopharyngeal swab, presence of viral mutation(s) within the areas targeted by this assay, and inadequate number of viral copies(<138 copies/mL). A negative result must be combined with clinical observations, patient history, and epidemiological information. The expected result is Negative.  Fact Sheet  for Patients:  12/14/20  Fact Sheet for Healthcare Providers:  12/13/20  This test is no t yet approved or cleared by the [place] FDA and  has been authorized for detection and/or diagnosis of SARS-CoV-2 by FDA under an Emergency Use Authorization (EUA). This EUA will remain  in effect (meaning this test can be used) for the duration of the COVID-19 declaration under Section 564(b)(1) of the Act, 21 U.S.C.section 360bbb-3(b)(1), unless the authorization is terminated  or revoked sooner.       Influenza A by PCR NEGATIVE NEGATIVE Final   Influenza B by PCR NEGATIVE NEGATIVE Final    Comment: (NOTE) The Xpert Xpress SARS-CoV-2/FLU/RSV plus assay is intended as an aid in the diagnosis of influenza from Nasopharyngeal swab specimens and should not  be used as a sole basis for treatment. Nasal washings and aspirates are unacceptable for Xpert Xpress SARS-CoV-2/FLU/RSV testing.  Fact Sheet for Patients: BloggerCourse.com  Fact Sheet for Healthcare Providers: SeriousBroker.it  This test is not yet approved or cleared by the [place] FDA and has been authorized for detection and/or diagnosis of SARS-CoV-2 by FDA under an Emergency Use Authorization (EUA). This EUA will remain in effect (meaning this test can be used) for the duration of the COVID-19 declaration under Section 564(b)(1) of the Act, 21 U.S.C. section 360bbb-3(b)(1), unless the authorization is terminated or revoked.  Performed at [facility], [address], [place], [place] [address]       Radiology Studies: No results found.   Scheduled Meds:  divalproex  250 mg Oral BID   enoxaparin (LOVENOX) injection  40 mg Subcutaneous Q24H   risperiDONE  1 mg Oral BID   sertraline  100 mg Oral QHS   vitamin B-12  500 mcg Oral Daily   Continuous Infusions:     LOS: 4 days      Darlin Priestly, MD [facility] If 7PM-7AM, please contact night-coverage 10/17/2020, 6:23 PM

## 2020-10-18 LAB — BASIC METABOLIC PANEL
Anion gap: 9 (ref 5–15)
BUN: 13 mg/dL (ref 8–23)
CO2: 24 mmol/L (ref 22–32)
Calcium: 8.3 mg/dL — ABNORMAL LOW (ref 8.9–10.3)
Chloride: 105 mmol/L (ref 98–111)
Creatinine, Ser: 0.61 mg/dL (ref 0.61–1.24)
GFR, Estimated: >60 mL/min (ref >60)
Glucose, Bld: 100 mg/dL — ABNORMAL HIGH (ref 70–99)
Potassium: 3.9 mmol/L (ref 3.5–5.1)
Sodium: 138 mmol/L (ref 135–145)

## 2020-10-18 LAB — CBC
HCT: 32.8 % — ABNORMAL LOW (ref 39.0–52.0)
Hemoglobin: 11.2 g/dL — ABNORMAL LOW (ref 13.0–17.0)
MCH: 33.1 pg (ref 26.0–34.0)
MCHC: 34.1 g/dL (ref 30.0–36.0)
MCV: 97 fL (ref 80.0–100.0)
Platelets: 386 10*3/uL (ref 150–400)
RBC: 3.38 MIL/uL — ABNORMAL LOW (ref 4.22–5.81)
RDW: 13.2 % (ref 11.5–15.5)
WBC: 9.1 10*3/uL (ref 4.0–10.5)
nRBC: 0 % (ref 0.0–0.2)

## 2020-10-18 LAB — MAGNESIUM: Magnesium: 2.2 mg/dL (ref 1.7–2.4)

## 2020-10-18 LAB — METHYLMALONIC ACID, SERUM: Methylmalonic Acid, Quantitative: 124 nmol/L (ref 0–378)

## 2020-10-18 NOTE — TOC Progression Note (Signed)
Transition of Care [facility]) - Progression Note    Patient Details  Name: Duane Anderson MRN: [id number] Date of Birth: [birth year]  Transition of Care [facility]) CM/SW Contact  Barrie Dunker, RN Phone Number: 10/18/2020, 3:08 PM  Clinical Narrative:     Spoke with the patient's son Duane Anderson on the telephone he is currently in [place] acting as a caregiver to his brother, I provided the bed choices to Duane Anderson, He will take a look on Medicare.gov at the star ratings and call with a choice, He stated that he would prefer to wait on getting the 4th Covid Vaccine at this time, I explained it would mean that the patient will be quarantined, he stated understanding, He stated that the patient will need [place] to transport to SNF at [place], I explained that I would take care f getting theat lined up  Expected Discharge Plan: Skilled Nursing Facility Barriers to Discharge: Continued Medical Work up  Expected Discharge Plan and Services Expected Discharge Plan: Skilled Nursing Facility       Living arrangements for the past 2 months: Assisted 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

## 2020-10-18 NOTE — TOC Progression Note (Signed)
Transition of Care [facility]) - Progression Note    Patient Details  Name: Duane Anderson MRN: [id number] Date of Birth: [birth year]  Transition of Care [facility]) CM/SW Contact  Barrie Dunker, RN Phone Number: 10/18/2020, 2:36 PM  Clinical Narrative:    Attempted to reach the son twice and left voice mails to review bed offers, Unable to reach, awaiting a call back   Expected Discharge Plan: Skilled Nursing Facility Barriers to Discharge: Continued Medical Work up  Expected Discharge Plan and Services Expected Discharge Plan: Skilled Nursing Facility       Living arrangements for the past 2 months: Assisted 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

## 2020-10-18 NOTE — Care Management Important Message (Signed)
Important Message  Patient Details  Name: Duane Anderson MRN: [id number] Date of Birth: Aug 30, 1940   Medicare Important Message Given:  N/A - LOS <3 / Initial given by admissions     [name] 10/18/2020, 12:08 PM

## 2020-10-18 NOTE — TOC Progression Note (Addendum)
Transition of Care [facility]) - Progression Note    Patient Details  Name: Duane Anderson MRN: [id number] Date of Birth: [birth year]  Transition of Care [facility]) CM/SW Contact  Barrie Dunker, RN Phone Number: 10/18/2020, 1:43 PM  Clinical Narrative:     After no bed offers, I sent the Bed request to all facilities in the Hub, awiaitng offers to review  Expected Discharge Plan: Skilled Nursing Facility Barriers to Discharge: Continued Medical Work up  Expected Discharge Plan and Services Expected Discharge Plan: Skilled Nursing Facility       Living arrangements for the past 2 months: Assisted 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

## 2020-10-18 NOTE — Progress Notes (Signed)
PROGRESS NOTE    Duane Anderson  XTG:[id number] DOB: [birth year] DOA: 10/13/2020 PCP: [facility]  143A/143A-AA   Assessment & Plan:   Principal Problem:   Acute encephalopathy Active Problems:   Elevated CPK   Acute prerenal azotemia   Leukocytosis   B12 deficiency   Dehydration   Duane Anderson is a 80 y.o. male with medical history significant for dementia, vitamin B12 deficiency, who is admitted to [facility] on 10/13/2020 with acute encephalopathy after presenting from SNF to [facility] ED for further evaluation of lethargy.    #) Acute encephalopathy, improved --reportedly more lethargy relative to baseline dementia, the patient presents with 1 week of progressive lethargy, diminished responsiveness to verbal stimuli, diminished ambulatory activity, and inability to follow instructions.   --No overt evidence of underlying infectious process --CT head shows no evidence of acute intracranial process --Pt's multiple psych meds were held after recent ED visit, so withdrawal is in Ddx.  Home psych med resumed. Plan:  --cont home risperdal, zoloft and depakote   #) Rhabdo --CK elevated at 965, trended down with IVF.  Possibly due to recent lethargy and bed-bound status.   #) Dehydration:  Diagnosis on the basis of presenting labs reflecting prerenal azotemia without overt AKI, while physical exam is notable for the presence of dry oral mucosa membranes, while in the context of significant decline in oral intake over the last several days, as further detailed above.   --s/p IVF --encourage oral hydration     #) Vitamin B12 deficiency:  --cont home vit B12 supplement   DVT prophylaxis: Lovenox SQ Code Status: DNR  Family Communication:  Level of care: Med-Surg Dispo:   The patient is from: ALF Anticipated d/c is to: SNF Anticipated d/c date is: whenever bed available. Patient currently is medically ready to d/c.   Subjective and Interval History:   Not interacting today.   Objective: Vitals:   10/17/20 2126 10/18/20 0332 10/18/20 0739 10/18/20 1128  BP: (!) 121/95 (!) 162/82 124/86 128/74  Pulse: 85 78 68 (!) 59  Resp: 16 20 16 16   Temp: 98.9 F (37.2 C) 99 F (37.2 C) 98.4 F (36.9 C) 98 F (36.7 C)  TempSrc:      SpO2: 98% 97% 97% 100%  Weight:      Height:        Intake/Output Summary (Last 24 hours) at 10/18/2020 1516 Last data filed at 10/18/2020 1339 Gross per 24 hour  Intake 0 ml  Output 0 ml  Net 0 ml    Filed Weights   10/13/20 1522 10/14/20 0424  Weight: 85.3 kg 85.6 kg    Examination:   Constitutional: NAD, not interactive, resisting opening of his eyes CV: No cyanosis.   RESP: normal respiratory effort, on RA Extremities: No effusions, edema in BLE SKIN: warm, dry   Data Reviewed: I have personally reviewed following labs and imaging studies  CBC: Recent Labs  Lab 10/13/20 1546 10/14/20 0215 10/15/20 0518 10/16/20 0522 10/17/20 0448 10/18/20 0415  WBC 11.1* 11.0* 8.5 8.0 7.5 9.1  NEUTROABS 8.0* 8.2*  --   --   --   --   HGB 11.8* 11.3* 10.0* 10.1* 10.4* 11.2*  HCT 33.7* 34.2* 29.2* 29.2* 30.4* 32.8*  MCV 96.3 97.4 96.7 96.1 95.0 97.0  PLT 313 349 311 317 370 386   Basic Metabolic Panel: Recent Labs  Lab 10/14/20 0215 10/15/20 0518 10/16/20 0522 10/17/20 0448 10/18/20 0415  NA 137 137 135 138 138  K 3.6 3.5 3.6 3.5 3.9  CL 103 105 105 107 105  CO2 27 25 26 25 24   GLUCOSE 104* 87 102* 101* 100*  BUN 25* 19 13 9 13   CREATININE 0.65 0.62 0.53* 0.57* 0.61  CALCIUM 8.3* 7.9* 7.9* 7.8* 8.3*  MG 2.1 2.1 2.1 2.4 2.2  PHOS 3.0  --   --   --   --    GFR: Estimated Creatinine Clearance: 75.8 mL/min (by C-G formula based on SCr of 0.61 mg/dL). Liver Function Tests: Recent Labs  Lab 10/13/20 1546 10/14/20 0215  AST 51* 52*  ALT 27 26  ALKPHOS 41 42  BILITOT 0.8 1.0  PROT 6.6 6.6  ALBUMIN 3.3* 3.3*   No results for input(s): LIPASE, AMYLASE in the last 168 hours. Recent  Labs  Lab 10/14/20 0200  AMMONIA 10   Coagulation Profile: No results for input(s): INR, PROTIME in the last 168 hours. Cardiac Enzymes: Recent Labs  Lab 10/13/20 1546 10/14/20 0215  CKTOTAL 965* 756*   BNP (last 3 results) No results for input(s): PROBNP in the last 8760 hours. HbA1C: No results for input(s): HGBA1C in the last 72 hours. CBG: No results for input(s): GLUCAP in the last 168 hours. Lipid Profile: No results for input(s): CHOL, HDL, LDLCALC, TRIG, CHOLHDL, LDLDIRECT in the last 72 hours. Thyroid Function Tests: No results for input(s): TSH, T4TOTAL, FREET4, T3FREE, THYROIDAB in the last 72 hours.  Anemia Panel: No results for input(s): VITAMINB12, FOLATE, FERRITIN, TIBC, IRON, RETICCTPCT in the last 72 hours. Sepsis Labs: Recent Labs  Lab 10/14/20 0215  PROCALCITON <0.10    Recent Results (from the past 240 hour(s))  Resp Panel by RT-PCR (Flu A&B, Covid) Nasopharyngeal Swab     Status: None   Collection Time: 10/13/20  3:46 PM   Specimen: Nasopharyngeal Swab; Nasopharyngeal(NP) swabs in vial transport medium  Result Value Ref Range Status   SARS Coronavirus 2 by RT PCR NEGATIVE NEGATIVE Final    Comment: (NOTE) SARS-CoV-2 target nucleic acids are NOT DETECTED.  The SARS-CoV-2 RNA is generally detectable in upper respiratory specimens during the acute phase of infection. The lowest concentration of SARS-CoV-2 viral copies this assay can detect is 138 copies/mL. A negative result does not preclude SARS-Cov-2 infection and should not be used as the sole basis for treatment or other patient management decisions. A negative result may occur with  improper specimen collection/handling, submission of specimen other than nasopharyngeal swab, presence of viral mutation(s) within the areas targeted by this assay, and inadequate number of viral copies(<138 copies/mL). A negative result must be combined with clinical observations, patient history, and  epidemiological information. The expected result is Negative.  Fact Sheet for Patients:  12/14/20  Fact Sheet for Healthcare Providers:  12/13/20  This test is no t yet approved or cleared by the [employer] FDA and  has been authorized for detection and/or diagnosis of SARS-CoV-2 by FDA under an Emergency Use Authorization (EUA). This EUA will remain  in effect (meaning this test can be used) for the duration of the COVID-19 declaration under Section 564(b)(1) of the Act, 21 U.S.C.section 360bbb-3(b)(1), unless the authorization is terminated  or revoked sooner.       Influenza A by PCR NEGATIVE NEGATIVE Final   Influenza B by PCR NEGATIVE NEGATIVE Final    Comment: (NOTE) The Xpert Xpress SARS-CoV-2/FLU/RSV plus assay is intended as an aid in the diagnosis of influenza from Nasopharyngeal swab specimens and should not be used as a  sole basis for treatment. Nasal washings and aspirates are unacceptable for Xpert Xpress SARS-CoV-2/FLU/RSV testing.  Fact Sheet for Patients: BloggerCourse.com  Fact Sheet for Healthcare Providers: SeriousBroker.it  This test is not yet approved or cleared by the [place] FDA and has been authorized for detection and/or diagnosis of SARS-CoV-2 by FDA under an Emergency Use Authorization (EUA). This EUA will remain in effect (meaning this test can be used) for the duration of the COVID-19 declaration under Section 564(b)(1) of the Act, 21 U.S.C. section 360bbb-3(b)(1), unless the authorization is terminated or revoked.  Performed at [facility], [address] [place], [place] [address]       Radiology Studies: No results found.   Scheduled Meds:  divalproex  250 mg Oral BID   enoxaparin (LOVENOX) injection  40 mg Subcutaneous Q24H   risperiDONE  1 mg Oral BID   sertraline  100 mg Oral QHS   vitamin  B-12  500 mcg Oral Daily   Continuous Infusions:     LOS: 5 days     Darlin Priestly, MD [facility] If 7PM-7AM, please contact night-coverage 10/18/2020, 3:16 PM

## 2020-10-18 NOTE — TOC Progression Note (Signed)
No bed offers at this time, Sent message out to each local facility requesting that they review the patient for a possible bed offer, once offers are obtained will review with the patient's son, I called [facility] and requested the covid vaccine dates, Spoke with Corrie Dandy, The first dose was on 11/10 21, 2nd 04/07/20, third 05/05/20, has not had the 4th dose

## 2020-10-18 NOTE — Progress Notes (Signed)
PT Cancellation Note  Patient Details Name: Duane Anderson MRN: [id number] DOB: 1940/04/01   Cancelled Treatment:    Reason Eval/Treat Not Completed: Other (comment). Chart reviewed. Pt received supine in bed, blanket covering face, pt asleep. PT was unable to arouse pt with audial or tactile stimulation. Will attempt treatment at a later date.   Basilia Jumbo PT, DPT 10/18/20 12:51 PM [phone]  [name] 10/18/2020, 12:49 PM

## 2020-10-19 NOTE — TOC Progression Note (Signed)
Transition of Care [facility]) - Progression Note    Patient Details  Name: Duane Anderson MRN: [id number] Date of Birth: 04-01-1940  Transition of Care [facility]) CM/SW Contact  Barrie Dunker, RN Phone Number: 10/19/2020, 9:01 AM  Clinical Narrative:    The son called back and stated that he does not want [facility] or [facility], I provided him with the other bed options, he is asking if we can send out to somewhere else, I let him know I have sent to every facility in the hub, I reached out to [facility] and requested a bed offer   Expected Discharge Plan: Skilled Nursing Facility Barriers to Discharge: Continued Medical Work up  Expected Discharge Plan and Services Expected Discharge Plan: Skilled Nursing Facility       Living arrangements for the past 2 months: Assisted 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

## 2020-10-19 NOTE — TOC Progression Note (Signed)
Transition of Care [facility]) - Progression Note    Patient Details  Name: Duane Anderson MRN: [id number] Date of Birth: 1940-06-12  Transition of Care [facility]) CM/SW Contact  Barrie Dunker, RN Phone Number: 10/19/2020, 9:34 AM  Clinical Narrative:    Had a discussion with Arlys John the patient's son, He would like to switch the focus to make it for a long term bed and not STR, I reached out and left a VM for [facility] and faxed the clinical notes with a message looking for a long term bed, also reached out to [facility] and requested to take a look for Long term as well. Resent in the hub with Long term being in the notes, awiaitng a bed offer   Expected Discharge Plan: Skilled Nursing Facility Barriers to Discharge: Continued Medical Work up  Expected Discharge Plan and Services Expected Discharge Plan: Skilled Nursing Facility       Living arrangements for the past 2 months: Assisted 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

## 2020-10-19 NOTE — Progress Notes (Signed)
PROGRESS NOTE    Duane Anderson  FGH:[id number] DOB: 09-12-1940 DOA: 10/13/2020 PCP: [facility]  143A/143A-AA   Assessment & Plan:   Principal Problem:   Acute encephalopathy Active Problems:   Elevated CPK   Acute prerenal azotemia   Leukocytosis   B12 deficiency   Dehydration   Duane Anderson is a 80 y.o. male with medical history significant for dementia, vitamin B12 deficiency, who is admitted to [facility] on 10/13/2020 with acute encephalopathy after presenting from SNF to [facility] ED for further evaluation of lethargy.    #) Acute encephalopathy, improved --reportedly more lethargy relative to baseline dementia, the patient presents with 1 week of progressive lethargy, diminished responsiveness to verbal stimuli, diminished ambulatory activity, and inability to follow instructions.   --No overt evidence of underlying infectious process --CT head shows no evidence of acute intracranial process --Pt's multiple psych meds were held after recent ED visit, so withdrawal is in Ddx.  Home psych med resumed. Plan:  --cont home risperdal, zoloft and depakote   #) Rhabdo --CK elevated at 965, trended down with IVF.  Possibly due to recent lethargy and bed-bound status.   #) Dehydration:  Diagnosis on the basis of presenting labs reflecting prerenal azotemia without overt AKI, while physical exam is notable for the presence of dry oral mucosa membranes, while in the context of significant decline in oral intake over the last several days, as further detailed above.   --s/p IVF --encourage oral hydration     #) Vitamin B12 deficiency:  --cont home vit B12 supplement   DVT prophylaxis: Lovenox SQ Code Status: DNR  Family Communication:  Level of care: Med-Surg Dispo:   The patient is from: ALF Anticipated d/c is to: long-term SNF Anticipated d/c date is: whenever bed available. Patient currently is medically ready to d/c.   Subjective and Interval  History:  Eyes opened today, but did not respond to questions or commands.  Family decided to pursue long-term SNF placement.   Objective: Vitals:   10/19/20 0507 10/19/20 0728 10/19/20 1125 10/19/20 1522  BP: 124/88 114/64 (!) 106/55 112/73  Pulse: 75 81 80 87  Resp: 20 14 18 16   Temp: 97.8 F (36.6 C) 98.8 F (37.1 C) 98.4 F (36.9 C) 97.9 F (36.6 C)  TempSrc:  Oral Oral   SpO2: 100% 98% 98% 99%  Weight:      Height:        Intake/Output Summary (Last 24 hours) at 10/19/2020 1715 Last data filed at 10/18/2020 1851 Gross per 24 hour  Intake --  Output 750 ml  Net -750 ml    Filed Weights   10/13/20 1522 10/14/20 0424  Weight: 85.3 kg 85.6 kg    Examination:   Constitutional: NAD, eyes open, but not responding to questions or commands  HEENT: conjunctivae and lids normal, EOMI CV: No cyanosis.   RESP: normal respiratory effort, on RA Extremities: No effusions, edema in BLE SKIN: warm, dry   Data Reviewed: I have personally reviewed following labs and imaging studies  CBC: Recent Labs  Lab 10/13/20 1546 10/14/20 0215 10/15/20 0518 10/16/20 0522 10/17/20 0448 10/18/20 0415  WBC 11.1* 11.0* 8.5 8.0 7.5 9.1  NEUTROABS 8.0* 8.2*  --   --   --   --   HGB 11.8* 11.3* 10.0* 10.1* 10.4* 11.2*  HCT 33.7* 34.2* 29.2* 29.2* 30.4* 32.8*  MCV 96.3 97.4 96.7 96.1 95.0 97.0  PLT 313 349 311 317 370 386   Basic  Metabolic Panel: Recent Labs  Lab 10/14/20 0215 10/15/20 0518 10/16/20 0522 10/17/20 0448 10/18/20 0415  NA 137 137 135 138 138  K 3.6 3.5 3.6 3.5 3.9  CL 103 105 105 107 105  CO2 27 25 26 25 24   GLUCOSE 104* 87 102* 101* 100*  BUN 25* 19 13 9 13   CREATININE 0.65 0.62 0.53* 0.57* 0.61  CALCIUM 8.3* 7.9* 7.9* 7.8* 8.3*  MG 2.1 2.1 2.1 2.4 2.2  PHOS 3.0  --   --   --   --    GFR: Estimated Creatinine Clearance: 75.8 mL/min (by C-G formula based on SCr of 0.61 mg/dL). Liver Function Tests: Recent Labs  Lab 10/13/20 1546 10/14/20 0215  AST 51*  52*  ALT 27 26  ALKPHOS 41 42  BILITOT 0.8 1.0  PROT 6.6 6.6  ALBUMIN 3.3* 3.3*   No results for input(s): LIPASE, AMYLASE in the last 168 hours. Recent Labs  Lab 10/14/20 0200  AMMONIA 10   Coagulation Profile: No results for input(s): INR, PROTIME in the last 168 hours. Cardiac Enzymes: Recent Labs  Lab 10/13/20 1546 10/14/20 0215  CKTOTAL 965* 756*   BNP (last 3 results) No results for input(s): PROBNP in the last 8760 hours. HbA1C: No results for input(s): HGBA1C in the last 72 hours. CBG: No results for input(s): GLUCAP in the last 168 hours. Lipid Profile: No results for input(s): CHOL, HDL, LDLCALC, TRIG, CHOLHDL, LDLDIRECT in the last 72 hours. Thyroid Function Tests: No results for input(s): TSH, T4TOTAL, FREET4, T3FREE, THYROIDAB in the last 72 hours.  Anemia Panel: No results for input(s): VITAMINB12, FOLATE, FERRITIN, TIBC, IRON, RETICCTPCT in the last 72 hours. Sepsis Labs: Recent Labs  Lab 10/14/20 0215  PROCALCITON <0.10    Recent Results (from the past 240 hour(s))  Resp Panel by RT-PCR (Flu A&B, Covid) Nasopharyngeal Swab     Status: None   Collection Time: 10/13/20  3:46 PM   Specimen: Nasopharyngeal Swab; Nasopharyngeal(NP) swabs in vial transport medium  Result Value Ref Range Status   SARS Coronavirus 2 by RT PCR NEGATIVE NEGATIVE Final    Comment: (NOTE) SARS-CoV-2 target nucleic acids are NOT DETECTED.  The SARS-CoV-2 RNA is generally detectable in upper respiratory specimens during the acute phase of infection. The lowest concentration of SARS-CoV-2 viral copies this assay can detect is 138 copies/mL. A negative result does not preclude SARS-Cov-2 infection and should not be used as the sole basis for treatment or other patient management decisions. A negative result may occur with  improper specimen collection/handling, submission of specimen other than nasopharyngeal swab, presence of viral mutation(s) within the areas targeted by  this assay, and inadequate number of viral copies(<138 copies/mL). A negative result must be combined with clinical observations, patient history, and epidemiological information. The expected result is Negative.  Fact Sheet for Patients:  12/14/20  Fact Sheet for Healthcare Providers:  12/13/20  This test is no t yet approved or cleared by the [employer] FDA and  has been authorized for detection and/or diagnosis of SARS-CoV-2 by FDA under an Emergency Use Authorization (EUA). This EUA will remain  in effect (meaning this test can be used) for the duration of the COVID-19 declaration under Section 564(b)(1) of the Act, 21 U.S.C.section 360bbb-3(b)(1), unless the authorization is terminated  or revoked sooner.       Influenza A by PCR NEGATIVE NEGATIVE Final   Influenza B by PCR NEGATIVE NEGATIVE Final    Comment: (NOTE) The Xpert Xpress  SARS-CoV-2/FLU/RSV plus assay is intended as an aid in the diagnosis of influenza from Nasopharyngeal swab specimens and should not be used as a sole basis for treatment. Nasal washings and aspirates are unacceptable for Xpert Xpress SARS-CoV-2/FLU/RSV testing.  Fact Sheet for Patients: BloggerCourse.com  Fact Sheet for Healthcare Providers: SeriousBroker.it  This test is not yet approved or cleared by the [place] FDA and has been authorized for detection and/or diagnosis of SARS-CoV-2 by FDA under an Emergency Use Authorization (EUA). This EUA will remain in effect (meaning this test can be used) for the duration of the COVID-19 declaration under Section 564(b)(1) of the Act, 21 U.S.C. section 360bbb-3(b)(1), unless the authorization is terminated or revoked.  Performed at [facility], [address], [place], [place] [address]       Radiology Studies: No results found.   Scheduled Meds:   divalproex  250 mg Oral BID   enoxaparin (LOVENOX) injection  40 mg Subcutaneous Q24H   risperiDONE  1 mg Oral BID   sertraline  100 mg Oral QHS   vitamin B-12  500 mcg Oral Daily   Continuous Infusions:     LOS: 6 days     Darlin Priestly, MD [facility] If 7PM-7AM, please contact night-coverage 10/19/2020, 5:15 PM

## 2020-10-19 NOTE — TOC Progression Note (Signed)
Transition of Care [facility]) - Progression Note    Patient Details  Name: Duane Anderson MRN: [id number] Date of Birth: March 17, 1940  Transition [facility] Care [facility]) CM/SW Contact  Barrie Dunker, RN Phone Number: 10/19/2020, 1:56 PM  Clinical Narrative:     Spoke with the patient's son Duane Anderson, He is agreeable to work with a 3rd party Bed placement company named [facility], they specialize in long term placement and the son is looking for bed placement in the [facility] [place], [employer] will call the son and work with placement, FL2 updated to show long term care  Expected Discharge Plan: Skilled Nursing Facility Barriers to Discharge: Continued Medical Work up  Expected Discharge Plan and Services Expected Discharge Plan: Skilled Nursing Facility       Living arrangements for the past 2 months: Assisted 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

## 2020-10-19 NOTE — [place] FL2 (Addendum)
  [place] MEDICAID FL2 LEVEL OF CARE SCREENING TOOL     IDENTIFICATION  Patient Name: Duane Anderson Birthdate: 06/03/1940 Sex: male Admission Date (Current Location): 10/13/2020  [facility] and [place] Number:  Chiropodist and Address:  [facility], [address], [place], [place] [address]      Provider Number: [id number]  Attending Physician Name and Address:  Darlin Priestly, MD  Relative Name and Phone Number:  Arlys John (son) [phone]    Current Level of Care: Hospital Recommended Level of Care: [facility], Other (Comment) (Long Term Care facility) Prior Approval Number:    Date Approved/Denied:   PASRR Number: [id number]  Discharge Plan: Other (Comment) (Long Term Care Home or facility)    Current Diagnoses: Patient Active Problem List   Diagnosis Date Noted   Elevated CPK 10/14/2020   Acute prerenal azotemia 10/14/2020   Leukocytosis 10/14/2020   B12 deficiency 10/14/2020   Dehydration 10/14/2020   Acute encephalopathy 10/13/2020    Orientation RESPIRATION BLADDER Height & Weight      (disoriented X4)  Normal Incontinent, External catheter Weight: 85.6 kg Height:  5\' 8"  (172.7 cm)  BEHAVIORAL SYMPTOMS/MOOD NEUROLOGICAL BOWEL NUTRITION STATUS      Incontinent Diet (Dysphagia 2)  AMBULATORY STATUS COMMUNICATION OF NEEDS Skin   Extensive Assist Verbally Normal                       Personal Care Assistance Level of Assistance  Bathing, Feeding, Dressing, Total care Bathing Assistance: Maximum assistance Feeding assistance: Maximum assistance Dressing Assistance: Maximum assistance Total Care Assistance: Maximum assistance   Functional Limitations Info  Sight, Hearing, Speech Sight Info: Adequate Hearing Info: Adequate Speech Info: Adequate    SPECIAL CARE FACTORS FREQUENCY  PT (By licensed PT), OT (By licensed OT)     PT Frequency: min 4x weekly OT Frequency: min 4x weekly            Contractures  Contractures Info: Not present    Additional Factors Info  Code Status, Allergies Code Status Info: DNR Allergies Info: NKDA           Current Medications (10/19/2020):  This is the current hospital active medication list Current Facility-Administered Medications  Medication Dose Route Frequency Provider Last Rate Last Admin   acetaminophen (TYLENOL) tablet 650 mg  650 mg Oral Q6H PRN Howerter, Justin B, DO   650 mg at 10/16/20 2154   Or   acetaminophen (TYLENOL) suppository 650 mg  650 mg Rectal Q6H PRN Howerter, Justin B, DO       divalproex (DEPAKOTE SPRINKLE) capsule 250 mg  250 mg Oral BID 2155, MD   250 mg at 10/19/20 0846   enoxaparin (LOVENOX) injection 40 mg  40 mg Subcutaneous Q24H 12/19/20, MD   40 mg at 10/19/20 0851   risperiDONE (RISPERDAL) tablet 1 mg  1 mg Oral BID 12/19/20, MD   1 mg at 10/19/20 0846   sertraline (ZOLOFT) tablet 100 mg  100 mg Oral QHS 12/19/20, MD   100 mg at 10/18/20 2324   vitamin B-12 (CYANOCOBALAMIN) tablet 500 mcg  500 mcg Oral Daily 12/18/20, MD   500 mcg at 10/19/20 12/19/20     Discharge Medications: Please see discharge summary for a list of discharge medications.  Relevant Imaging Results:  Relevant Lab Results:   Additional Information SSN:[id number]  11-28-1999, RN

## 2020-10-20 MED ORDER — HYDRALAZINE HCL 20 MG/ML IJ SOLN: 5.0000 mg | Freq: Once | INTRAMUSCULAR | Status: DC

## 2020-10-20 NOTE — Progress Notes (Signed)
PROGRESS NOTE    Duane Anderson  VQQ:[id number] DOB: Oct 18, 1940 DOA: 10/13/2020 PCP: Almetta Lovely, [facility]   Brief Narrative:  Duane Anderson is a 80 y.o. male with medical history significant for dementia, vitamin B12 deficiency, who is admitted to [facility] on 10/13/2020 with acute encephalopathy after presenting from SNF to [facility] ED for further evaluation of lethargy.   Medically patient is at or approaching baseline however remains lethargic with poor p.o. intake.  TOC is looking for long-term care placement.  Assessment & Plan:   Principal Problem:   Acute encephalopathy Active Problems:   Elevated CPK   Acute prerenal azotemia   Leukocytosis   B12 deficiency   Dehydration  #) Acute encephalopathy, improved --reportedly more lethargy relative to baseline dementia, the patient presents with 1 week of progressive lethargy, diminished responsiveness to verbal stimuli, diminished ambulatory activity, and inability to follow instructions.   --No overt evidence of underlying infectious process --CT head shows no evidence of acute intracranial process --Pt's multiple psych meds were held after recent ED visit, so withdrawal is in Ddx.  Home psych med resumed. Plan: --cont home risperdal, zoloft and depakote   #) Rhabdo --CK elevated at 965, trended down with IVF.   Possibly due to recent lethargy and bed-bound status. Further IVF   #) Dehydration: Diagnosis on the basis of presenting labs reflecting prerenal azotemia without overt AKI, while physical exam is notable for the presence of dry oral mucosa membranes, while in the context of significant decline in oral intake over the last several days, as further detailed above.   --s/p IVF -- Intravenous fluids discontinued.  Will encourage oral hydration     #) Vitamin B12 deficiency: --cont home vit B12 supplement   DVT prophylaxis: SQ Lovenox Code Status: DNR Family Communication: None today Disposition  Plan: Status is: Inpatient  Remains inpatient appropriate because:Inpatient level of care appropriate due to severity of illness  Dispo: The patient is from: ALF              Anticipated d/c is to:  Long-term care SNF bed              Patient currently is medically stable to d/c.   Difficult to place patient Yes  Patient medically stable at this time.  Needs long-term care bed.     Level of care: Med-Surg  Consultants:  None  Procedures:  None  Antimicrobials:  None   Subjective: Seen and examined.  Opens eyes.  Does not follow commands.  Objective: Vitals:   10/19/20 2318 10/20/20 0200 10/20/20 0536 10/20/20 0739  BP: (!) 190/88 (!) 153/118 124/62 95/61  Pulse: 86 96 61 63  Resp: 20  19 16   Temp: 98.8 F (37.1 C)  97.8 F (36.6 C) 98.6 F (37 C)  TempSrc:      SpO2: 91%  100% 93%  Weight:      Height:        Intake/Output Summary (Last 24 hours) at 10/20/2020 1341 Last data filed at 10/19/2020 2310 Gross per 24 hour  Intake 180 ml  Output --  Net 180 ml   Filed Weights   10/13/20 1522 10/14/20 0424  Weight: 85.3 kg 85.6 kg    Examination:  General exam: No acute distress.  Frail and chronically ill Respiratory system: Clear to auscultation. Respiratory effort normal. Cardiovascular system: S1-S2, regular rate and rhythm, no murmurs, trace pedal edema BLE gastrointestinal system: Nontender, nondistended, normal bowel sounds Central nervous system: Alert,  oriented x0.  No focal deficits Extremities: Diffusely decreased power bilaterally Skin: No rashes, lesions or ulcers Psychiatry: Judgement and insight appear impaired. Mood & affect flattened.     Data Reviewed: I have personally reviewed following labs and imaging studies  CBC: Recent Labs  Lab 10/13/20 1546 10/14/20 0215 10/15/20 0518 10/16/20 0522 10/17/20 0448 10/18/20 0415  WBC 11.1* 11.0* 8.5 8.0 7.5 9.1  NEUTROABS 8.0* 8.2*  --   --   --   --   HGB 11.8* 11.3* 10.0* 10.1* 10.4*  11.2*  HCT 33.7* 34.2* 29.2* 29.2* 30.4* 32.8*  MCV 96.3 97.4 96.7 96.1 95.0 97.0  PLT 313 349 311 317 370 386   Basic Metabolic Panel: Recent Labs  Lab 10/14/20 0215 10/15/20 0518 10/16/20 0522 10/17/20 0448 10/18/20 0415  NA 137 137 135 138 138  K 3.6 3.5 3.6 3.5 3.9  CL 103 105 105 107 105  CO2 27 25 26 25 24   GLUCOSE 104* 87 102* 101* 100*  BUN 25* 19 13 9 13   CREATININE 0.65 0.62 0.53* 0.57* 0.61  CALCIUM 8.3* 7.9* 7.9* 7.8* 8.3*  MG 2.1 2.1 2.1 2.4 2.2  PHOS 3.0  --   --   --   --    GFR: Estimated Creatinine Clearance: 75.8 mL/min (by C-G formula based on SCr of 0.61 mg/dL). Liver Function Tests: Recent Labs  Lab 10/13/20 1546 10/14/20 0215  AST 51* 52*  ALT 27 26  ALKPHOS 41 42  BILITOT 0.8 1.0  PROT 6.6 6.6  ALBUMIN 3.3* 3.3*   No results for input(s): LIPASE, AMYLASE in the last 168 hours. Recent Labs  Lab 10/14/20 0200  AMMONIA 10   Coagulation Profile: No results for input(s): INR, PROTIME in the last 168 hours. Cardiac Enzymes: Recent Labs  Lab 10/13/20 1546 10/14/20 0215  CKTOTAL 965* 756*   BNP (last 3 results) No results for input(s): PROBNP in the last 8760 hours. HbA1C: No results for input(s): HGBA1C in the last 72 hours. CBG: No results for input(s): GLUCAP in the last 168 hours. Lipid Profile: No results for input(s): CHOL, HDL, LDLCALC, TRIG, CHOLHDL, LDLDIRECT in the last 72 hours. Thyroid Function Tests: No results for input(s): TSH, T4TOTAL, FREET4, T3FREE, THYROIDAB in the last 72 hours. Anemia Panel: No results for input(s): VITAMINB12, FOLATE, FERRITIN, TIBC, IRON, RETICCTPCT in the last 72 hours. Sepsis Labs: Recent Labs  Lab 10/14/20 0215  PROCALCITON <0.10    Recent Results (from the past 240 hour(s))  Resp Panel by RT-PCR (Flu A&B, Covid) Nasopharyngeal Swab     Status: None   Collection Time: 10/13/20  3:46 PM   Specimen: Nasopharyngeal Swab; Nasopharyngeal(NP) swabs in vial transport medium  Result Value Ref  Range Status   SARS Coronavirus 2 by RT PCR NEGATIVE NEGATIVE Final    Comment: (NOTE) SARS-CoV-2 target nucleic acids are NOT DETECTED.  The SARS-CoV-2 RNA is generally detectable in upper respiratory specimens during the acute phase of infection. The lowest concentration of SARS-CoV-2 viral copies this assay can detect is 138 copies/mL. A negative result does not preclude SARS-Cov-2 infection and should not be used as the sole basis for treatment or other patient management decisions. A negative result may occur with  improper specimen collection/handling, submission of specimen other than nasopharyngeal swab, presence of viral mutation(s) within the areas targeted by this assay, and inadequate number of viral copies(<138 copies/mL). A negative result must be combined with clinical observations, patient history, and epidemiological information. The expected result is  Negative.  Fact Sheet for Patients:  BloggerCourse.com  Fact Sheet for Healthcare Providers:  SeriousBroker.it  This test is no t yet approved or cleared by the [place] FDA and  has been authorized for detection and/or diagnosis of SARS-CoV-2 by FDA under an Emergency Use Authorization (EUA). This EUA will remain  in effect (meaning this test can be used) for the duration of the COVID-19 declaration under Section 564(b)(1) of the Act, 21 U.S.C.section 360bbb-3(b)(1), unless the authorization is terminated  or revoked sooner.       Influenza A by PCR NEGATIVE NEGATIVE Final   Influenza B by PCR NEGATIVE NEGATIVE Final    Comment: (NOTE) The Xpert Xpress SARS-CoV-2/FLU/RSV plus assay is intended as an aid in the diagnosis of influenza from Nasopharyngeal swab specimens and should not be used as a sole basis for treatment. Nasal washings and aspirates are unacceptable for Xpert Xpress SARS-CoV-2/FLU/RSV testing.  Fact Sheet for  Patients: BloggerCourse.com  Fact Sheet for Healthcare Providers: SeriousBroker.it  This test is not yet approved or cleared by the [place] FDA and has been authorized for detection and/or diagnosis of SARS-CoV-2 by FDA under an Emergency Use Authorization (EUA). This EUA will remain in effect (meaning this test can be used) for the duration of the COVID-19 declaration under Section 564(b)(1) of the Act, 21 U.S.C. section 360bbb-3(b)(1), unless the authorization is terminated or revoked.  Performed at [facility], [address], [place], [place] [address]          Radiology Studies: No results found.      Scheduled Meds:  divalproex  250 mg Oral BID   enoxaparin (LOVENOX) injection  40 mg Subcutaneous Q24H   hydrALAZINE  5 mg Intramuscular Once   risperiDONE  1 mg Oral BID   sertraline  100 mg Oral QHS   vitamin B-12  500 mcg Oral Daily   Continuous Infusions:   LOS: 7 days    Time spent: 25 minutes    Tresa Moore, MD [facility] Pager [phone]  If 7PM-7AM, please contact night-coverage 10/20/2020, 1:41 PM

## 2020-10-20 NOTE — Plan of Care (Signed)
Pt is very difficult to encourage eating/ drinking. Attempted to give nutritional shake more than once without success. Problem: Elimination: Goal: Will not experience complications related to bowel motility Outcome: Progressing Goal: Will not experience complications related to urinary retention Outcome: Progressing   Problem: Pain Managment: Goal: General experience of comfort will improve Outcome: Progressing   Problem: Safety: Goal: Ability to remain free from injury will improve Outcome: Progressing   Problem: Skin Integrity: Goal: Risk for impaired skin integrity will decrease Outcome: Progressing

## 2020-10-20 NOTE — TOC Progression Note (Addendum)
Transition of Care [facility]) - Progression Note    Patient Details  Name: Jackston Oaxaca MRN: [id number] Date of Birth: 1941/01/25  [facility]) CM/SW Contact  Barrie Dunker, RN Phone Number: 10/20/2020, 9:01 AM  Clinical Narrative:    Reached out to Danielle with [facility] to get the status of the long term bed search, They are working on a few leads and she will get back to me, the patient's son would like him to be in the [facility] and [place] area, the patient does have long term insurance, I faxed the clinical notes to [phone]   Expected Discharge Plan: Skilled Nursing Facility Barriers to Discharge: Continued Medical Work up  Expected Discharge Plan and Services Expected Discharge Plan: Skilled Nursing Facility       Living arrangements for the past 2 months: Assisted Living Facility [place])                                       Social Determinants of Health (SDOH) Interventions    Readmission Risk Interventions No flowsheet data found.

## 2020-10-20 NOTE — Plan of Care (Signed)

## 2020-10-20 NOTE — Progress Notes (Signed)
PT Cancellation Note  Patient Details Name: Duane Anderson MRN: [id number] DOB: 15-May-1940   Cancelled Treatment:    Reason Eval/Treat Not Completed: Other (comment). Chart review performed. Pt awake, not alert or oriented. Pt unable to follow one step command to open eyes - eyes remained shut while PT was in room. Pt inappropriate for therapy due to baseline cognitive deficit and inability to participate with multiple attempts - will sign off at this time. Please re-consult PT if participation/cooperation improves.  Basilia Jumbo PT, DPT 10/20/20 1:20 PM [phone]  [name] 10/20/2020, 1:15 PM

## 2020-10-21 NOTE — Plan of Care (Signed)
  Problem: Education: Goal: Knowledge of General Education information will improve Description: Including pain rating scale, medication(s)/side effects and non-pharmacologic comfort measures Outcome: Not Progressing   Problem: Health Behavior/Discharge Planning: Goal: Ability to manage health-related needs will improve Outcome: Not Progressing   Problem: Clinical Measurements: Goal: Ability to maintain clinical measurements within normal limits will improve Outcome: Not Progressing Goal: Will remain free from infection Outcome: Not Progressing Goal: Diagnostic test results will improve Outcome: Not Progressing Goal: Respiratory complications will improve Outcome: Not Progressing Goal: Cardiovascular complication will be avoided Outcome: Not Progressing   Problem: Activity: Goal: Risk for activity intolerance will decrease Outcome: Not Progressing   Problem: Nutrition: Goal: Adequate nutrition will be maintained Outcome: Not Progressing   Problem: Coping: Goal: Level of anxiety will decrease Outcome: Not Progressing   Problem: Pain Managment: Goal: General experience of comfort will improve Outcome: Not Progressing   Problem: Skin Integrity: Goal: Risk for impaired skin integrity will decrease Outcome: Not Progressing

## 2020-10-21 NOTE — Plan of Care (Signed)
Patient disoriented, no evidence of pain with assess via PAINAID. Consumed medications PO and crushed. Vitals stable, no respiratory distress on room air. No adverse events during shift, will continue to monitor.  Problem: Education: Goal: Knowledge of General Education information will improve Description: Including pain rating scale, medication(s)/side effects and non-pharmacologic comfort measures Outcome: Progressing   Problem: Health Behavior/Discharge Planning: Goal: Ability to manage health-related needs will improve Outcome: Progressing   Problem: Clinical Measurements: Goal: Ability to maintain clinical measurements within normal limits will improve Outcome: Progressing Goal: Will remain free from infection Outcome: Progressing Goal: Diagnostic test results will improve Outcome: Progressing Goal: Respiratory complications will improve Outcome: Progressing Goal: Cardiovascular complication will be avoided Outcome: Progressing   Problem: Activity: Goal: Risk for activity intolerance will decrease Outcome: Progressing   Problem: Nutrition: Goal: Adequate nutrition will be maintained Outcome: Progressing   Problem: Coping: Goal: Level of anxiety will decrease Outcome: Progressing   Problem: Elimination: Goal: Will not experience complications related to bowel motility Outcome: Progressing Goal: Will not experience complications related to urinary retention Outcome: Progressing   Problem: Pain Managment: Goal: General experience of comfort will improve Outcome: Progressing

## 2020-10-21 NOTE — Progress Notes (Signed)
PROGRESS NOTE    Rondell Pardon  AVW:[id number] DOB: 1940/07/19 DOA: 10/13/2020 PCP: Almetta Lovely, [facility]   Brief Narrative:  Lamberto Dinapoli is a 80 y.o. male with medical history significant for dementia, vitamin B12 deficiency, who is admitted to [facility] on 10/13/2020 with acute encephalopathy after presenting from SNF to [facility] ED for further evaluation of lethargy.   Medically patient is at or approaching baseline however remains lethargic with poor p.o. intake.  TOC is looking for long-term care placement.  Assessment & Plan:   Principal Problem:   Acute encephalopathy Active Problems:   Elevated CPK   Acute prerenal azotemia   Leukocytosis   B12 deficiency   Dehydration  #) Acute encephalopathy, improved --reportedly more lethargy relative to baseline dementia, the patient presents with 1 week of progressive lethargy, diminished responsiveness to verbal stimuli, diminished ambulatory activity, and inability to follow instructions.   --No overt evidence of underlying infectious process --CT head shows no evidence of acute intracranial process --Pt's multiple psych meds were held after recent ED visit, so withdrawal is in Ddx.  Home psych med resumed. Plan: --cont home risperdal, zoloft and depakote -- Delirium precautions   #) Rhabdo --CK elevated at 965, trended down with IVF.   Possibly due to recent lethargy and bed-bound status. No further intravenous fluids   #) Dehydration: Diagnosis on the basis of presenting labs reflecting prerenal azotemia without overt AKI, while physical exam is notable for the presence of dry oral mucosa membranes, while in the context of significant decline in oral intake over the last several days, as further detailed above.   --s/p IVF -- Intravenous fluids discontinued.  Will encourage oral hydration     #) Vitamin B12 deficiency: --cont home vit B12 supplement   DVT prophylaxis: SQ Lovenox Code Status:  DNR Family Communication: None today Disposition Plan: Status is: Inpatient  Remains inpatient appropriate because:Inpatient level of care appropriate due to severity of illness  Dispo: The patient is from: ALF              Anticipated d/c is to:  Long-term care SNF bed              Patient currently is medically stable to d/c.   Difficult to place patient Yes  Patient medically stable at this time.  Needs long-term care bed.     Level of care: Med-Surg  Consultants:  None  Procedures:  None  Antimicrobials:  None   Subjective: Seen and examined.  Opens eyes.  Does not follow commands.  Objective: Vitals:   10/20/20 2336 10/21/20 0444 10/21/20 0500 10/21/20 0735  BP: 125/64 109/69  100/64  Pulse: 79 68  69  Resp: 17 17  17   Temp: 98 F (36.7 C) 98.5 F (36.9 C)  98.5 F (36.9 C)  TempSrc:      SpO2: 99% 96%  96%  Weight:   79.1 kg   Height:       No intake or output data in the 24 hours ending 10/21/20 1008  Filed Weights   10/13/20 1522 10/14/20 0424 10/21/20 0500  Weight: 85.3 kg 85.6 kg 79.1 kg    Examination:  General exam: No acute distress.  Frail and chronically ill Respiratory system: Clear to auscultation. Respiratory effort normal. Cardiovascular system: S1-S2, regular rate and rhythm, no murmurs, trace pedal edema BLE gastrointestinal system: Nontender, nondistended, normal bowel sounds Central nervous system: Alert, oriented x0.  No focal deficits Extremities: Diffusely decreased power bilaterally  Skin: No rashes, lesions or ulcers Psychiatry: Judgement and insight appear impaired. Mood & affect flattened.     Data Reviewed: I have personally reviewed following labs and imaging studies  CBC: Recent Labs  Lab 10/15/20 0518 10/16/20 0522 10/17/20 0448 10/18/20 0415  WBC 8.5 8.0 7.5 9.1  HGB 10.0* 10.1* 10.4* 11.2*  HCT 29.2* 29.2* 30.4* 32.8*  MCV 96.7 96.1 95.0 97.0  PLT 311 317 370 386   Basic Metabolic Panel: Recent Labs   Lab 10/15/20 0518 10/16/20 0522 10/17/20 0448 10/18/20 0415  NA 137 135 138 138  K 3.5 3.6 3.5 3.9  CL 105 105 107 105  CO2 25 26 25 24   GLUCOSE 87 102* 101* 100*  BUN 19 13 9 13   CREATININE 0.62 0.53* 0.57* 0.61  CALCIUM 7.9* 7.9* 7.8* 8.3*  MG 2.1 2.1 2.4 2.2   GFR: Estimated Creatinine Clearance: 68.9 mL/min (by C-G formula based on SCr of 0.61 mg/dL). Liver Function Tests: No results for input(s): AST, ALT, ALKPHOS, BILITOT, PROT, ALBUMIN in the last 168 hours.  No results for input(s): LIPASE, AMYLASE in the last 168 hours. No results for input(s): AMMONIA in the last 168 hours.  Coagulation Profile: No results for input(s): INR, PROTIME in the last 168 hours. Cardiac Enzymes: No results for input(s): CKTOTAL, CKMB, CKMBINDEX, TROPONINI in the last 168 hours.  BNP (last 3 results) No results for input(s): PROBNP in the last 8760 hours. HbA1C: No results for input(s): HGBA1C in the last 72 hours. CBG: No results for input(s): GLUCAP in the last 168 hours. Lipid Profile: No results for input(s): CHOL, HDL, LDLCALC, TRIG, CHOLHDL, LDLDIRECT in the last 72 hours. Thyroid Function Tests: No results for input(s): TSH, T4TOTAL, FREET4, T3FREE, THYROIDAB in the last 72 hours. Anemia Panel: No results for input(s): VITAMINB12, FOLATE, FERRITIN, TIBC, IRON, RETICCTPCT in the last 72 hours. Sepsis Labs: No results for input(s): PROCALCITON, LATICACIDVEN in the last 168 hours.   Recent Results (from the past 240 hour(s))  Resp Panel by RT-PCR (Flu A&B, Covid) Nasopharyngeal Swab     Status: None   Collection Time: 10/13/20  3:46 PM   Specimen: Nasopharyngeal Swab; Nasopharyngeal(NP) swabs in vial transport medium  Result Value Ref Range Status   SARS Coronavirus 2 by RT PCR NEGATIVE NEGATIVE Final    Comment: (NOTE) SARS-CoV-2 target nucleic acids are NOT DETECTED.  The SARS-CoV-2 RNA is generally detectable in upper respiratory specimens during the acute phase of  infection. The lowest concentration of SARS-CoV-2 viral copies this assay can detect is 138 copies/mL. A negative result does not preclude SARS-Cov-2 infection and should not be used as the sole basis for treatment or other patient management decisions. A negative result may occur with  improper specimen collection/handling, submission of specimen other than nasopharyngeal swab, presence of viral mutation(s) within the areas targeted by this assay, and inadequate number of viral copies(<138 copies/mL). A negative result must be combined with clinical observations, patient history, and epidemiological information. The expected result is Negative.  Fact Sheet for Patients:   Fact Sheet for Healthcare Providers:  12/13/20  This test is no t yet approved or cleared by the [employer] FDA and  has been authorized for detection and/or diagnosis of SARS-CoV-2 by FDA under an Emergency Use Authorization (EUA). This EUA will remain  in effect (meaning this test can be used) for the duration of the COVID-19 declaration under Section 564(b)(1) of the Act, 21 U.S.C.section 360bbb-3(b)(1), unless the authorization is terminated  or revoked sooner.       Influenza A by PCR NEGATIVE NEGATIVE Final   Influenza B by PCR NEGATIVE NEGATIVE Final    Comment: (NOTE) The Xpert Xpress SARS-CoV-2/FLU/RSV plus assay is intended as an aid in the diagnosis of influenza from Nasopharyngeal swab specimens and should not be used as a sole basis for treatment. Nasal washings and aspirates are unacceptable for Xpert Xpress SARS-CoV-2/FLU/RSV testing.  Fact Sheet for Patients: BloggerCourse.com  Fact Sheet for Healthcare Providers: SeriousBroker.it  This test is not yet approved or cleared by the [place] FDA and has been authorized for detection and/or diagnosis of SARS-CoV-2  by FDA under an Emergency Use Authorization (EUA). This EUA will remain in effect (meaning this test can be used) for the duration of the COVID-19 declaration under Section 564(b)(1) of the Act, 21 U.S.C. section 360bbb-3(b)(1), unless the authorization is terminated or revoked.  Performed at [facility], [address] [place], [place] [address]          Radiology Studies: No results found.      Scheduled Meds:  divalproex  250 mg Oral BID   enoxaparin (LOVENOX) injection  40 mg Subcutaneous Q24H   hydrALAZINE  5 mg Intramuscular Once   risperiDONE  1 mg Oral BID   sertraline  100 mg Oral QHS   vitamin B-12  500 mcg Oral Daily   Continuous Infusions:   LOS: 8 days    Time spent: 15 minutes    Tresa Moore, MD [facility] Pager [phone]  If 7PM-7AM, please contact night-coverage 10/21/2020, 10:08 AM

## 2020-10-21 NOTE — TOC Progression Note (Signed)
Transition of Care [facility]) - Progression Note    Patient Details  Name: Duane Anderson MRN: [id number] Date of Birth: 01-Apr-1940  Transition of Care [facility]) CM/SW Contact  Barrie Dunker, RN Phone Number: 10/21/2020, 2:19 PM  Clinical Narrative:     [name] with [facility] came by to see the patient and the Son Duane Anderson is to make a bed choice this afternoon, Danielle to confirm a bed tomorrow,  Expected Discharge Plan: Skilled Nursing Facility Barriers to Discharge: Continued Medical Work up  Expected Discharge Plan and Services Expected Discharge Plan: Skilled Nursing Facility       Living arrangements for the past 2 months: Assisted 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

## 2020-10-21 NOTE — TOC Progression Note (Signed)
Transition of Care [facility]) - Progression Note    Patient Details  Name: Duane Anderson MRN: [id number] Date of Birth: Mar 08, 1941  Transition of Care [facility]) CM/SW Contact  Barrie Dunker, RN Phone Number: 10/21/2020, 10:09 AM  Clinical Narrative:     Reached out to [name] with [facility] and requested an update on the status of the bed search for a long term bed in the [employer] area, awaiting a call back  Expected Discharge Plan: Skilled Nursing Facility Barriers to Discharge: Continued Medical Work up  Expected Discharge Plan and Services Expected Discharge Plan: Skilled Nursing Facility       Living arrangements for the past 2 months: Assisted 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

## 2020-10-21 NOTE — TOC Progression Note (Signed)
Transition of Care [facility]) - Progression Note    Patient Details  Name: Duane Anderson MRN: [id number] Date of Birth: 10-Dec-1940  Transition of Care [facility]) CM/SW Contact  Barrie Dunker, RN Phone Number: 10/21/2020, 1:04 PM  Clinical Narrative:    Sherron Monday with Victorino Dike with [facility], she will come and evaluate the patient and assess later today   Expected Discharge Plan: Skilled Nursing Facility Barriers to Discharge: Continued Medical Work up  Expected Discharge Plan and Services Expected Discharge Plan: Skilled Nursing Facility       Living arrangements for the past 2 months: Assisted 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

## 2020-10-21 NOTE — Care Management Important Message (Signed)
Important Message  Patient Details  Name: Duane Anderson MRN: [id number] Date of Birth: 05/26/1940   Medicare Important Message Given:  Yes  Reviewed the Important Message from Medicare with son, Arvle Grabe by phone [phone]) and he is in agreement with the discharge plan. I asked if he would like a copy and he replied yes. I sent securely to [email or web address]. I thanked him for his time.  Olegario Messier A Octavion Mollenkopf 10/21/2020, 3:31 PM

## 2020-10-21 NOTE — Progress Notes (Signed)
Civil engineer, contracting [facility])  Received request for determination of hospice services and/or potential residential hospice. Mr. Dirr does not appear to be appropriate for Hospice Home, but does appear to be hospice appropriate in LTC.  Previously from AL, but he cannot return to his AL. Plan is to find a LTC facility that can care for him. Once one has been located, if it is within our service area, we would be happy to assist further.  [facility] will follow peripherally for d/c location and then assist with hospice services.  Wallis Bamberg RN, BSN, CCRN [facility] Liaison

## 2020-10-22 ENCOUNTER — Encounter: Payer: Self-pay | Admitting: Internal Medicine

## 2020-10-22 MED ORDER — ADULT MULTIVITAMIN W/MINERALS CH
1.0000 | ORAL_TABLET | Freq: Every day | ORAL | Status: DC
  Administered 2020-10-22: 1 via ORAL

## 2020-10-22 MED ORDER — ENSURE ENLIVE PO LIQD
237.0000 mL | Freq: Two times a day (BID) | ORAL | Status: DC
  Administered 2020-10-22: 237 mL via ORAL

## 2020-10-22 NOTE — TOC Progression Note (Signed)
Transition of Care [facility]) - Progression Note    Patient Details  Name: Duane Anderson MRN: [id number] Date of Birth: 1940-11-10  Transition of Care [facility]) CM/SW Contact  Caryn Section, RN Phone Number: 10/22/2020, 1:18 PM  Clinical Narrative:   Patient will be re-evaluated for hospice house today, as staff state he has not been arousable and has not been able to eat today.    Son Arlys John agrees to facility with [facility] hospice if unable to place in hospice house.  RNCM will await response from [facility] re: disposition.  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

## 2020-10-22 NOTE — Progress Notes (Signed)
PROGRESS NOTE    Duane Anderson  CHY:[id number] DOB: [birth year] DOA: 10/13/2020 PCP: Almetta Lovely, [facility]   Brief Narrative:  Duane Anderson is a 80 y.o. male with medical history significant for dementia, vitamin B12 deficiency, who is admitted to [facility] on 10/13/2020 with acute encephalopathy after presenting from SNF to [facility] ED for further evaluation of lethargy.   Medically patient is at or approaching baseline however remains lethargic with poor p.o. intake.  TOC is looking for long-term care placement.  Ideally will need a long-term care bed with hospice backup.  Would like to avoid future hospitalizations.  Assessment & Plan:   Principal Problem:   Acute encephalopathy Active Problems:   Elevated CPK   Acute prerenal azotemia   Leukocytosis   B12 deficiency   Dehydration  #) Acute encephalopathy --reportedly more lethargy relative to baseline dementia, the patient presents with 1 week of progressive lethargy, diminished responsiveness to verbal stimuli, diminished ambulatory activity, and inability to follow instructions.   --No overt evidence of underlying infectious process --CT head shows no evidence of acute intracranial process --Pt's multiple psych meds were held after recent ED visit, so withdrawal is in Ddx.  Home psych med resumed. Plan: Will continue current psychiatric regimen and delirium precautions.  His encephalopathy and poor p.o. intake is likely manifestation of underlying cognitive decline and adult failure to thrive.  In my opinion he is appropriate for inpatient hospice services however hospice liaison disagreed.  Per patient's son he was previously evaluated by [facility] hospice services and at that time felt not to be a candidate however his clinical situation has changed and likely represents end-stage cognitive decline.  Will notify TOC to reengage [facility] services.  Current disposition plan is for long-term care with hospice backup.    #) Rhabdo --CK elevated at 965, trended down with IVF.   Possibly due to recent lethargy and bed-bound status. No further intravenous fluids   #) Dehydration: Diagnosis on the basis of presenting labs reflecting prerenal azotemia without overt AKI, while physical exam is notable for the presence of dry oral mucosa membranes, while in the context of significant decline in oral intake over the last several days, as further detailed above.   --s/p IVF -- Intravenous fluids discontinued.  Will encourage oral hydration     #) Vitamin B12 deficiency: --cont home vit B12 supplement   DVT prophylaxis: SQ Lovenox Code Status: DNR Family Communication: Jolaine Artist [phone] on 8/12 Disposition Plan: Status is: Inpatient  Remains inpatient appropriate because:Inpatient level of care appropriate due to severity of illness  Dispo: The patient is from: ALF              Anticipated d/c is to:  Long-term care SNF bed              Patient currently is medically stable to d/c.   Difficult to place patient Yes  Essentially medically stable for discharge at this time.  Pending long-term care bed.  Also will need to reengage hospice services.     Level of care: Med-Surg  Consultants:  None  Procedures:  None  Antimicrobials:  None   Subjective: Seen and examined.  Opens eyes.  Does not follow commands.  Objective: Vitals:   10/22/20 0439 10/22/20 0449 10/22/20 0721 10/22/20 1138  BP: (!) 137/98  (!) 145/127 107/71  Pulse: 75  93 93  Resp: 19  16 16   Temp: 97.8 F (36.6 C)  98.7 F (37.1 C) 97.9 F (  36.6 C)  TempSrc:    Oral  SpO2: 96%  97% 99%  Weight:  76 kg    Height:        Intake/Output Summary (Last 24 hours) at 10/22/2020 1216 Last data filed at 10/21/2020 1900 Gross per 24 hour  Intake 0 ml  Output --  Net 0 ml    Filed Weights   10/14/20 0424 10/21/20 0500 10/22/20 0449  Weight: 85.6 kg 79.1 kg 76 kg    Examination:  General exam: No acute distress.   Frail and chronically ill Respiratory system: Clear to auscultation. Respiratory effort normal. Cardiovascular system: S1-S2, regular rate and rhythm, no murmurs, trace pedal edema BLE gastrointestinal system: Nontender, nondistended, normal bowel sounds Central nervous system: Alert, oriented x0.  No focal deficits Extremities: Diffusely decreased power bilaterally Skin: No rashes, lesions or ulcers Psychiatry: Judgement and insight appear impaired. Mood & affect flattened.     Data Reviewed: I have personally reviewed following labs and imaging studies  CBC: Recent Labs  Lab 10/16/20 0522 10/17/20 0448 10/18/20 0415  WBC 8.0 7.5 9.1  HGB 10.1* 10.4* 11.2*  HCT 29.2* 30.4* 32.8*  MCV 96.1 95.0 97.0  PLT 317 370 386   Basic Metabolic Panel: Recent Labs  Lab 10/16/20 0522 10/17/20 0448 10/18/20 0415  NA 135 138 138  K 3.6 3.5 3.9  CL 105 107 105  CO2 26 25 24   GLUCOSE 102* 101* 100*  BUN 13 9 13   CREATININE 0.53* 0.57* 0.61  CALCIUM 7.9* 7.8* 8.3*  MG 2.1 2.4 2.2   GFR: Estimated Creatinine Clearance: 68.9 mL/min (by C-G formula based on SCr of 0.61 mg/dL). Liver Function Tests: No results for input(s): AST, ALT, ALKPHOS, BILITOT, PROT, ALBUMIN in the last 168 hours.  No results for input(s): LIPASE, AMYLASE in the last 168 hours. No results for input(s): AMMONIA in the last 168 hours.  Coagulation Profile: No results for input(s): INR, PROTIME in the last 168 hours. Cardiac Enzymes: No results for input(s): CKTOTAL, CKMB, CKMBINDEX, TROPONINI in the last 168 hours.  BNP (last 3 results) No results for input(s): PROBNP in the last 8760 hours. HbA1C: No results for input(s): HGBA1C in the last 72 hours. CBG: No results for input(s): GLUCAP in the last 168 hours. Lipid Profile: No results for input(s): CHOL, HDL, LDLCALC, TRIG, CHOLHDL, LDLDIRECT in the last 72 hours. Thyroid Function Tests: No results for input(s): TSH, T4TOTAL, FREET4, T3FREE, THYROIDAB  in the last 72 hours. Anemia Panel: No results for input(s): VITAMINB12, FOLATE, FERRITIN, TIBC, IRON, RETICCTPCT in the last 72 hours. Sepsis Labs: No results for input(s): PROCALCITON, LATICACIDVEN in the last 168 hours.   Recent Results (from the past 240 hour(s))  Resp Panel by RT-PCR (Flu A&B, Covid) Nasopharyngeal Swab     Status: None   Collection Time: 10/13/20  3:46 PM   Specimen: Nasopharyngeal Swab; Nasopharyngeal(NP) swabs in vial transport medium  Result Value Ref Range Status   SARS Coronavirus 2 by RT PCR NEGATIVE NEGATIVE Final    Comment: (NOTE) SARS-CoV-2 target nucleic acids are NOT DETECTED.  The SARS-CoV-2 RNA is generally detectable in upper respiratory specimens during the acute phase of infection. The lowest concentration of SARS-CoV-2 viral copies this assay can detect is 138 copies/mL. A negative result does not preclude SARS-Cov-2 infection and should not be used as the sole basis for treatment or other patient management decisions. A negative result may occur with  improper specimen collection/handling, submission of specimen other than nasopharyngeal swab,  presence of viral mutation(s) within the areas targeted by this assay, and inadequate number of viral copies(<138 copies/mL). A negative result must be combined with clinical observations, patient history, and epidemiological information. The expected result is Negative.  Fact Sheet for Patients:  BloggerCourse.com  Fact Sheet for Healthcare Providers:  SeriousBroker.it  This test is no t yet approved or cleared by the [place] FDA and  has been authorized for detection and/or diagnosis of SARS-CoV-2 by FDA under an Emergency Use Authorization (EUA). This EUA will remain  in effect (meaning this test can be used) for the duration of the COVID-19 declaration under Section 564(b)(1) of the Act, 21 U.S.C.section 360bbb-3(b)(1), unless the  authorization is terminated  or revoked sooner.       Influenza A by PCR NEGATIVE NEGATIVE Final   Influenza B by PCR NEGATIVE NEGATIVE Final    Comment: (NOTE) The Xpert Xpress SARS-CoV-2/FLU/RSV plus assay is intended as an aid in the diagnosis of influenza from Nasopharyngeal swab specimens and should not be used as a sole basis for treatment. Nasal washings and aspirates are unacceptable for Xpert Xpress SARS-CoV-2/FLU/RSV testing.  Fact Sheet for Patients: BloggerCourse.com  Fact Sheet for Healthcare Providers: SeriousBroker.it  This test is not yet approved or cleared by the [place] FDA and has been authorized for detection and/or diagnosis of SARS-CoV-2 by FDA under an Emergency Use Authorization (EUA). This EUA will remain in effect (meaning this test can be used) for the duration of the COVID-19 declaration under Section 564(b)(1) of the Act, 21 U.S.C. section 360bbb-3(b)(1), unless the authorization is terminated or revoked.  Performed at [facility], [address], [place], [place] [address]          Radiology Studies: No results found.      Scheduled Meds:  divalproex  250 mg Oral BID   enoxaparin (LOVENOX) injection  40 mg Subcutaneous Q24H   hydrALAZINE  5 mg Intramuscular Once   risperiDONE  1 mg Oral BID   sertraline  100 mg Oral QHS   vitamin B-12  500 mcg Oral Daily   Continuous Infusions:   LOS: 9 days    Time spent: 15 minutes    Tresa Moore, MD [facility] Pager [phone]  If 7PM-7AM, please contact night-coverage 10/22/2020, 12:16 PM

## 2020-10-22 NOTE — Progress Notes (Signed)
[facility] Civil engineer, contracting [facility]) Hospital Liaison RN Note  Received request from Amado Nash, RN [facility] Manager for family interest in [facility]. Spoke with patient's son, Arlys John to confirm interest and explain services.   Unfortunately Hospice Home is unable to offer a room today. Plan is for patient to be transported to [facility] tomorrow, 8.13.22 with request that transport be set up for 1:00 pm. Family in agreement. TOC aware.   RN please call report to Hospice Home at [phone] prior to patient leaving the unit. Please send signed and completed DNR with patient at discharge.   Please do not hesitate to call with any hospice related questions.   Thank you for the opportunity to participate in this patient's care.   Bobbie "Einar Gip, RN, BSN [facility] Liaison [phone]

## 2020-10-22 NOTE — Progress Notes (Signed)
Initial Nutrition Assessment  DOCUMENTATION CODES:  Non-severe (moderate) malnutrition in context of chronic illness  INTERVENTION:  Continue current diet as tolerated Magic cup TID with meals, each supplement provides 290 kcal and 9 grams of protein Ensure Enlive po BID, each supplement provides 350 kcal and 20 grams of protein MVI with minerals daily  NUTRITION DIAGNOSIS:  Moderate Malnutrition (in the context of chronic illness (dementia)) related to poor appetite as evidenced by mild muscle depletion, mild fat depletion.  GOAL:  Patient will meet greater than or equal to 90% of their needs  MONITOR:  PO intake, Supplement acceptance  REASON FOR ASSESSMENT:  Malnutrition Screening Tool    ASSESSMENT:  80 y.o. male with medical history significant for dementia and vitamin B12 deficiency, presented to ED from SNF with AMS from baseline x 1 week.  Pt resting in bed at the time of visit, did not open eyes when name was called or during physical exam. Lunch tray at bedside, untouched aside from a few bites of magic cup. Mild muscle and fat deficits noted on exam, could be partially related to aging but exacerbated by poor intake. Would recommend adding nutrition supplements to assist in augmenting intake.  Average Meal Intake: 8/3-8/12: 33% intake x 1 recorded meal, all other meals 0% or "bites"  Nutritionally Relevant Medications: Scheduled Meds:  vitamin B-12  500 mcg Oral Daily   Labs Reviewed  NUTRITION - FOCUSED PHYSICAL EXAM: Flowsheet Row Most Recent Value  Orbital Region Mild depletion  Upper Arm Region No depletion  Thoracic and Lumbar Region No depletion  Buccal Region Mild depletion  Temple Region Mild depletion  Clavicle Bone Region Mild depletion  Clavicle and Acromion Bone Region Mild depletion  Scapular Bone Region No depletion  Dorsal Hand No depletion  Patellar Region No depletion  Anterior Thigh Region No depletion  Posterior Calf Region No depletion   Edema (RD Assessment) None  Hair Reviewed  Eyes Unable to assess  Mouth Unable to assess  Skin Reviewed  Nails Reviewed   Diet Order:   Diet Order             DIET DYS 2 Room service appropriate? Yes; Fluid consistency: Thin  Diet effective now                   EDUCATION NEEDS:  No education needs have been identified at this time  Skin:  Skin Assessment: Reviewed RN Assessment  Last BM:  8/10 - type 4  Height:  Ht Readings from Last 1 Encounters:  10/13/20 5\' 8"  (1.727 m)   Weight:  Wt Readings from Last 1 Encounters:  10/22/20 78.3 kg   Ideal Body Weight:  70 kg  BMI:  Body mass index is 26.25 kg/m.  Estimated Nutritional Needs:  Kcal:  1700-1900 kcal/d Protein:  85-95 g/d Fluid:  1.8-2 L/d   12/22/20, RD, LDN Clinical Dietitian Pager on Amion

## 2020-10-23 DIAGNOSIS — E440 Moderate protein-calorie malnutrition: Secondary | ICD-10-CM | POA: Insufficient documentation

## 2020-10-23 NOTE — Discharge Summary (Signed)
Physician Discharge Summary  Duane Anderson ZOX:[id number] DOB: 1941-03-02 DOA: 10/13/2020  PCP: Almetta Lovely, [facility]  Admit date: 10/13/2020 Discharge date: 10/23/2020  Admitted From: Home Disposition: Hospice home  Recommendations for Outpatient Follow-up:  To outpatient hospice providers  Home Health: No Equipment/Devices: None  Discharge Condition: Hospice CODE STATUS: DNR Diet recommendation: Dysphagia  Brief/Interim Summary: Duane Anderson is a 80 y.o. male with medical history significant for dementia, vitamin B12 deficiency, who is admitted to [facility] on 10/13/2020 with acute encephalopathy after presenting from SNF to [facility] ED for further evaluation of lethargy.    Medically patient is at or approaching baseline however remains lethargic with poor p.o. intake.  TOC is looking for long-term care placement.  Ideally will need a long-term care bed with hospice backup.  Would like to avoid future hospitalizations.  After consultation with hospice liaison patient was accepted to hospice home.  I discharged to hospice home on 8/13.  DNR signed and left on chart.   Discharge Diagnoses:  Principal Problem:   Acute encephalopathy Active Problems:   Elevated CPK   Acute prerenal azotemia   Leukocytosis   B12 deficiency   Dehydration   Malnutrition of moderate degree  Encephalopathy, acute delirium Adult failure to thrive Poor p.o. 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  Mental status remained poor during hospitalization.  No overt signs of infectious process.  CT imaging survey negative.  Psychiatric regimen was resumed.  Patient mental status remained poor p.o. intake remained poor.  Patient was evaluated by hospice liaison and deemed appropriate for discharge to hospice home.  Discharge Instructions  Discharge Instructions     Diet - low sodium heart healthy    Complete by: As directed    Increase activity slowly   Complete by: As directed       Allergies as of 10/23/2020   No Known Allergies      Medication List     STOP taking these medications    multivitamin with minerals Tabs tablet   NON FORMULARY   traZODone 50 MG tablet Commonly known as: DESYREL   vitamin B-12 500 MCG tablet Commonly known as: CYANOCOBALAMIN       TAKE these medications    acetaminophen 500 MG tablet Commonly known as: TYLENOL Take 1,000 mg by mouth 2 (two) times daily.   divalproex 125 MG capsule Commonly known as: DEPAKOTE SPRINKLE Take 250 mg by mouth 2 (two) times daily.   risperiDONE 1 MG tablet Commonly known as: RISPERDAL Take 1 mg by mouth 2 (two) times daily.   sertraline 100 MG tablet Commonly known as: ZOLOFT Take 100 mg by mouth at bedtime.        No Known Allergies  Consultations: Palliative care   Procedures/Studies: CT HEAD WO CONTRAST ( )  Result Date: 10/13/2020 CLINICAL DATA:  Mental status change, unknown cause. Additional history provided: Patient not following commands, but responding to pain. EXAM: CT HEAD WITHOUT CONTRAST TECHNIQUE: Contiguous axial images were obtained from the base of the skull through the vertex without intravenous contrast. COMPARISON:  Head CT 10/06/2020. FINDINGS: Brain: Moderate cerebral atrophy, most prominent within the frontal and temporal lobes. Commensurate prominence of the ventricles and sulci. Mild patchy and ill-defined hypoattenuation within the cerebral white matter, nonspecific but compatible with chronic small vessel ischemic disease. There is no acute intracranial hemorrhage. No demarcated cortical infarct. No extra-axial fluid collection. No evidence of an intracranial mass. No midline shift.  Vascular: No hyperdense vessel.  Atherosclerotic calcifications. Skull: Normal. Negative for fracture or focal lesion. Sinuses/Orbits: Visualized orbits show no acute finding.  Incompletely imaged left maxillary sinus mucous retention cyst measuring at least 19 mm. IMPRESSION: No evidence of acute intracranial abnormality. Moderate cerebral atrophy, most prominent within the frontal and temporal lobes. Mild chronic small-vessel ischemic changes within the cerebral white matter. Incompletely imaged left maxillary sinus mucous retention cyst. Electronically Signed   By: Jackey Loge DO   On: 10/13/2020 16:38   CT Head Wo Contrast  Result Date: 10/06/2020 CLINICAL DATA:  Mental status change EXAM: CT HEAD WITHOUT CONTRAST TECHNIQUE: Contiguous axial images were obtained from the base of the skull through the vertex without intravenous contrast. COMPARISON:  None. FINDINGS: Brain: Patient scanned in the decubitus position. Generalized atrophy. Prominent atrophy in the temporal lobes. Ventricular enlargement consistent with atrophy. Mild white matter hypodensity bilaterally. Negative for acute infarct, hemorrhage, mass Vascular: Negative for hyperdense vessel Skull: Negative Sinuses/Orbits: Retention cyst left maxillary sinus. Remaining sinuses clear. Bilateral cataract extraction Other: None IMPRESSION: Generalized atrophy most severe in the temporal lobes. No acute abnormality. Electronically Signed   By: Marlan Palau M.D.   On: 10/06/2020 20:31   DG Chest Portable 1 View  Result Date: 10/13/2020 CLINICAL DATA:  Altered mental status.  Rule out pneumonia. EXAM: PORTABLE CHEST 1 VIEW COMPARISON:  None. FINDINGS: Subtle patchy densities in the left perihilar region. Right lung is clear. Decreased lung volumes. Heart size is within normal limits. Negative for a pneumothorax. IMPRESSION: Low lung volumes with patchy densities in left perihilar region. Infection cannot be excluded. However, findings could be related to atelectasis or asymmetric vascular congestion/edema. Electronically Signed   By: Richarda Overlie M.D.   On: 10/13/2020 16:09   (Echo, Carotid, EGD, Colonoscopy, ERCP)     Subjective: Seen and examined on the day of discharge.  Lethargic.  Poor p.o. intake.  Discharge Exam: Vitals:   10/23/20 0359 10/23/20 0743  BP: 114/77 (!) 114/48  Pulse: 73 79  Resp: 16 15  Temp: 98.4 F (36.9 C) 98.2 F (36.8 C)  SpO2: 99%    Vitals:   10/22/20 1557 10/22/20 2137 10/23/20 0359 10/23/20 0743  BP: (!) 115/55 122/71 114/77 (!) 114/48  Pulse: 80 88 73 79  Resp: 16 16 16 15   Temp: 98.1 F (36.7 C) 98.7 F (37.1 C) 98.4 F (36.9 C) 98.2 F (36.8 C)  TempSrc:      SpO2: 94% 98% 99%   Weight:      Height:        General: Lethargic, no acute distress Cardiovascular: RRR, S1/S2 +, no rubs, no gallops Respiratory: CTA bilaterally, no wheezing, no rhonchi Abdominal: Soft, NT, ND, bowel sounds + Extremities: Significant muscle wasting bilateral upper and lower extremities    The results of significant diagnostics from this hospitalization (including imaging, microbiology, ancillary and laboratory) are listed below for reference.     Microbiology: Recent Results (from the past 240 hour(s))  Resp Panel by RT-PCR (Flu A&B, Covid) Nasopharyngeal Swab     Status: None   Collection Time: 10/13/20  3:46 PM   Specimen: Nasopharyngeal Swab; Nasopharyngeal(NP) swabs in vial transport medium  Result Value Ref Range Status   SARS Coronavirus 2 by RT PCR NEGATIVE NEGATIVE Final    Comment: (NOTE) SARS-CoV-2 target nucleic acids are NOT DETECTED.  The SARS-CoV-2 RNA is generally detectable in upper respiratory specimens during the acute phase of infection. The lowest concentration of SARS-CoV-2 viral  copies this assay can detect is 138 copies/mL. A negative result does not preclude SARS-Cov-2 infection and should not be used as the sole basis for treatment or other patient management decisions. A negative result may occur with  improper specimen collection/handling, submission of specimen other than nasopharyngeal swab, presence of viral mutation(s) within  the areas targeted by this assay, and inadequate number of viral copies(<138 copies/mL). A negative result must be combined with clinical observations, patient history, and epidemiological information. The expected result is Negative.  Fact Sheet for Patients:  BloggerCourse.com  Fact Sheet for Healthcare Providers:  SeriousBroker.it  This test is no t yet approved or cleared by the [place] FDA and  has been authorized for detection and/or diagnosis of SARS-CoV-2 by FDA under an Emergency Use Authorization (EUA). This EUA will remain  in effect (meaning this test can be used) for the duration of the COVID-19 declaration under Section 564(b)(1) of the Act, 21 U.S.C.section 360bbb-3(b)(1), unless the authorization is terminated  or revoked sooner.       Influenza A by PCR NEGATIVE NEGATIVE Final   Influenza B by PCR NEGATIVE NEGATIVE Final    Comment: (NOTE) The Xpert Xpress SARS-CoV-2/FLU/RSV plus assay is intended as an aid in the diagnosis of influenza from Nasopharyngeal swab specimens and should not be used as a sole basis for treatment. Nasal washings and aspirates are unacceptable for Xpert Xpress SARS-CoV-2/FLU/RSV testing.  Fact Sheet for Patients: BloggerCourse.com  Fact Sheet for Healthcare Providers: SeriousBroker.it  This test is not yet approved or cleared by the [place] FDA and has been authorized for detection and/or diagnosis of SARS-CoV-2 by FDA under an Emergency Use Authorization (EUA). This EUA will remain in effect (meaning this test can be used) for the duration of the COVID-19 declaration under Section 564(b)(1) of the Act, 21 U.S.C. section 360bbb-3(b)(1), unless the authorization is terminated or revoked.  Performed at [facility], [address] [place], [place] [address]      Labs: BNP (last 3 results) No results for  input(s): BNP in the last 8760 hours. Basic Metabolic Panel: Recent Labs  Lab 10/17/20 0448 10/18/20 0415  NA 138 138  K 3.5 3.9  CL 107 105  CO2 25 24  GLUCOSE 101* 100*  BUN 9 13  CREATININE 0.57* 0.61  CALCIUM 7.8* 8.3*  MG 2.4 2.2   Liver Function Tests: No results for input(s): AST, ALT, ALKPHOS, BILITOT, PROT, ALBUMIN in the last 168 hours. No results for input(s): LIPASE, AMYLASE in the last 168 hours. No results for input(s): AMMONIA in the last 168 hours. CBC: Recent Labs  Lab 10/17/20 0448 10/18/20 0415  WBC 7.5 9.1  HGB 10.4* 11.2*  HCT 30.4* 32.8*  MCV 95.0 97.0  PLT 370 386   Cardiac Enzymes: No results for input(s): CKTOTAL, CKMB, CKMBINDEX, TROPONINI in the last 168 hours. BNP: Invalid input(s): POCBNP CBG: No results for input(s): GLUCAP in the last 168 hours. D-Dimer No results for input(s): DDIMER in the last 72 hours. Hgb A1c No results for input(s): HGBA1C in the last 72 hours. Lipid Profile No results for input(s): CHOL, HDL, LDLCALC, TRIG, CHOLHDL, LDLDIRECT in the last 72 hours. Thyroid function studies No results for input(s): TSH, T4TOTAL, T3FREE, THYROIDAB in the last 72 hours.  Invalid input(s): FREET3 Anemia work up No results for input(s): VITAMINB12, FOLATE, FERRITIN, TIBC, IRON, RETICCTPCT in the last 72 hours. Urinalysis    Component Value Date/Time   COLORURINE AMBER (A) 10/14/2020 1000  APPEARANCEUR CLOUDY (A) 10/14/2020 1000   LABSPEC 1.031 (H) 10/14/2020 1000   PHURINE 5.0 10/14/2020 1000   GLUCOSEU NEGATIVE 10/14/2020 1000   HGBUR NEGATIVE 10/14/2020 1000   BILIRUBINUR NEGATIVE 10/14/2020 1000   KETONESUR 5 (A) 10/14/2020 1000   PROTEINUR NEGATIVE 10/14/2020 1000   NITRITE NEGATIVE 10/14/2020 1000   LEUKOCYTESUR NEGATIVE 10/14/2020 1000   Sepsis Labs Invalid input(s): PROCALCITONIN,  WBC,  LACTICIDVEN Microbiology Recent Results (from the past 240 hour(s))  Resp Panel by RT-PCR (Flu A&B, Covid) Nasopharyngeal  Swab     Status: None   Collection Time: 10/13/20  3:46 PM   Specimen: Nasopharyngeal Swab; Nasopharyngeal(NP) swabs in vial transport medium  Result Value Ref Range Status   SARS Coronavirus 2 by RT PCR NEGATIVE NEGATIVE Final    Comment: (NOTE) SARS-CoV-2 target nucleic acids are NOT DETECTED.  The SARS-CoV-2 RNA is generally detectable in upper respiratory specimens during the acute phase of infection. The lowest concentration of SARS-CoV-2 viral copies this assay can detect is 138 copies/mL. A negative result does not preclude SARS-Cov-2 infection and should not be used as the sole basis for treatment or other patient management decisions. A negative result may occur with  improper specimen collection/handling, submission of specimen other than nasopharyngeal swab, presence of viral mutation(s) within the areas targeted by this assay, and inadequate number of viral copies(<138 copies/mL). A negative result must be combined with clinical observations, patient history, and epidemiological information. The expected result is Negative.  Fact Sheet for Patients:  BloggerCourse.comhttps://www.fda.gov/media/152166/download  Fact Sheet for Healthcare Providers:  SeriousBroker.ithttps://www.fda.gov/media/152162/download  This test is no t yet approved or cleared by the [place] FDA and  has been authorized for detection and/or diagnosis of SARS-CoV-2 by FDA under an Emergency Use Authorization (EUA). This EUA will remain  in effect (meaning this test can be used) for the duration of the COVID-19 declaration under Section 564(b)(1) of the Act, 21 U.S.C.section 360bbb-3(b)(1), unless the authorization is terminated  or revoked sooner.       Influenza A by PCR NEGATIVE NEGATIVE Final   Influenza B by PCR NEGATIVE NEGATIVE Final    Comment: (NOTE) The Xpert Xpress SARS-CoV-2/FLU/RSV plus assay is intended as an aid in the diagnosis of influenza from Nasopharyngeal swab specimens and should not be used as a sole  basis for treatment. Nasal washings and aspirates are unacceptable for Xpert Xpress SARS-CoV-2/FLU/RSV testing.  Fact Sheet for Patients: BloggerCourse.comhttps://www.fda.gov/media/152166/download  Fact Sheet for Healthcare Providers: SeriousBroker.ithttps://www.fda.gov/media/152162/download  This test is not yet approved or cleared by the [place] FDA and has been authorized for detection and/or diagnosis of SARS-CoV-2 by FDA under an Emergency Use Authorization (EUA). This EUA will remain in effect (meaning this test can be used) for the duration of the COVID-19 declaration under Section 564(b)(1) of the Act, 21 U.S.C. section 360bbb-3(b)(1), unless the authorization is terminated or revoked.  Performed at [facility], [address] [place], [place] [id number]      Time coordinating discharge: Over 30 minutes  SIGNED:   Tresa MooreSudheer B Orrin Yurkovich, MD  [facility] 10/23/2020, 8:50 AM Pager   If 7PM-7AM, please contact night-coverage

## 2020-10-23 NOTE — Progress Notes (Signed)
Report called to [facility].

## 2020-11-11 DEATH — deceased

## 2022-12-12 IMAGING — CT CT HEAD W/O CM
3 series · 15 of 47 positions shown, 18 images · non-contrast
Comparison: Head CT 10/06/2020.

CLINICAL DATA: Mental status change, unknown cause. Additional
history provided: Patient not following commands, but responding to
pain.

EXAM:
CT HEAD WITHOUT CONTRAST
TECHNIQUE: Contiguous axial images were obtained from the base of the skull
through the vertex without intravenous contrast.

[Series 3: head wo · axial · 0.45mm/px · z∈[-132,-7]mm · 9 of 31 slices shown, 12 images]
[im 3/31  brain]
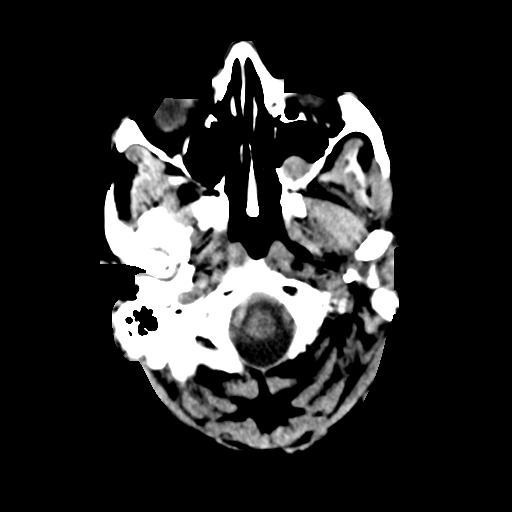
[im 3/31  bone]
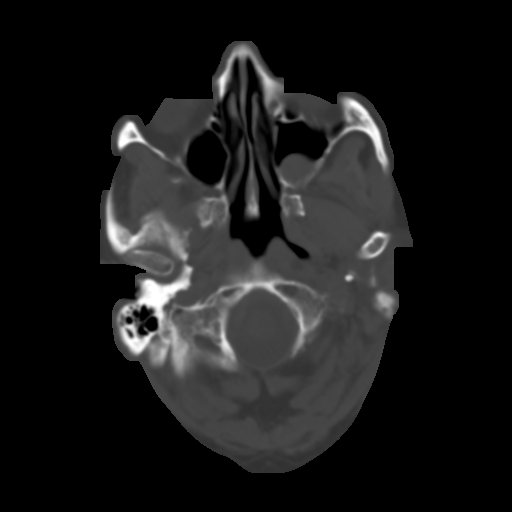
[im 6/31  brain]
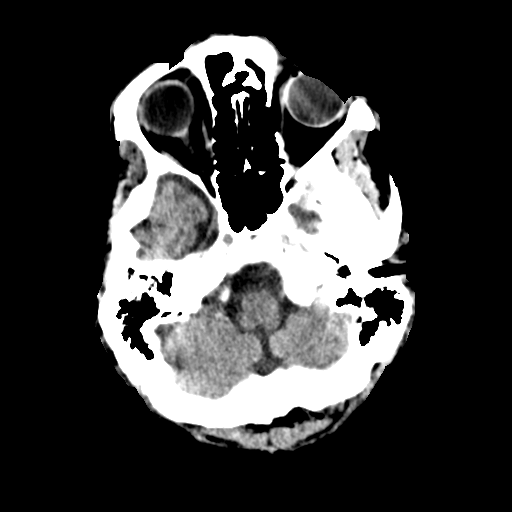
[im 9/31  brain]
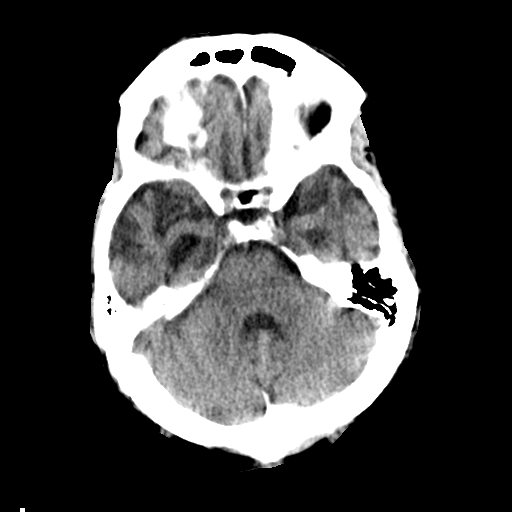
[im 12/31  brain]
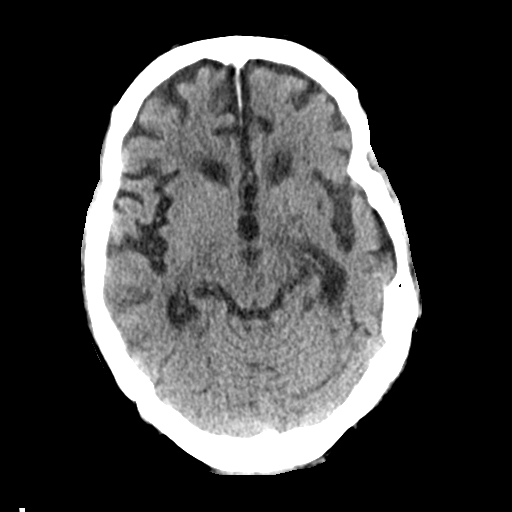
[im 16/31  brain]
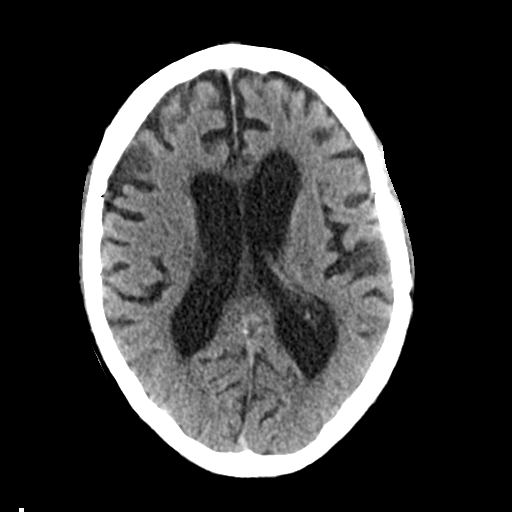
[im 16/31  bone]
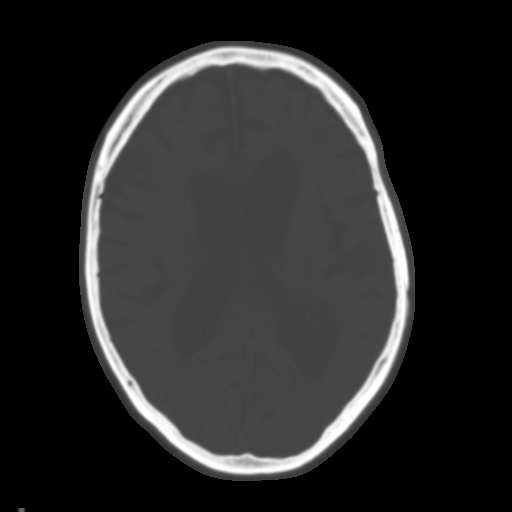
[im 19/31  brain]
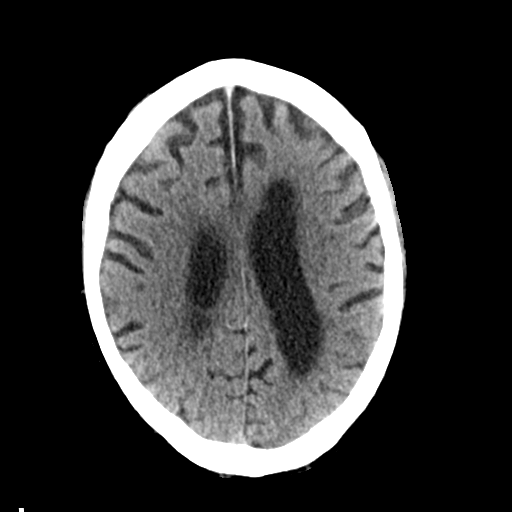
[im 22/31  brain]
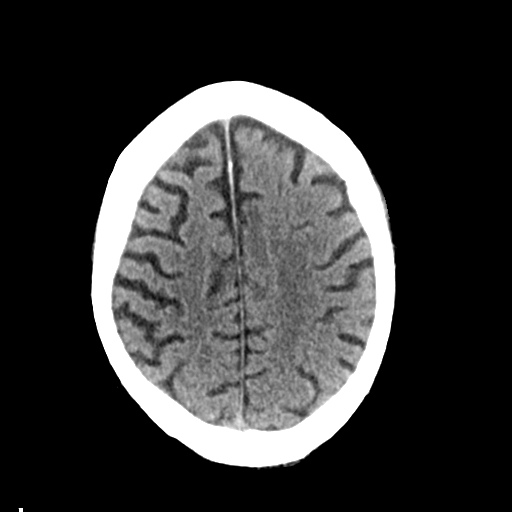
[im 25/31  brain]
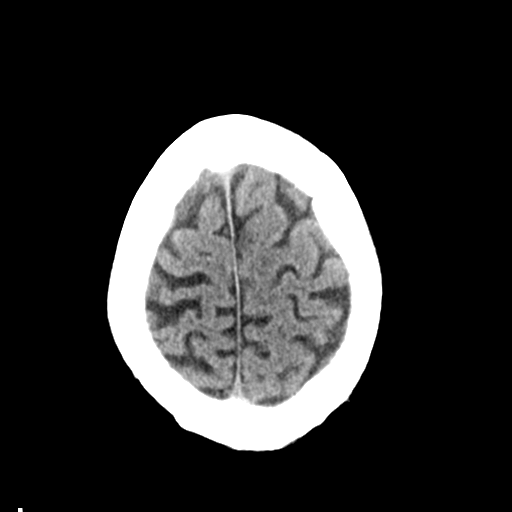
[im 28/31  brain]
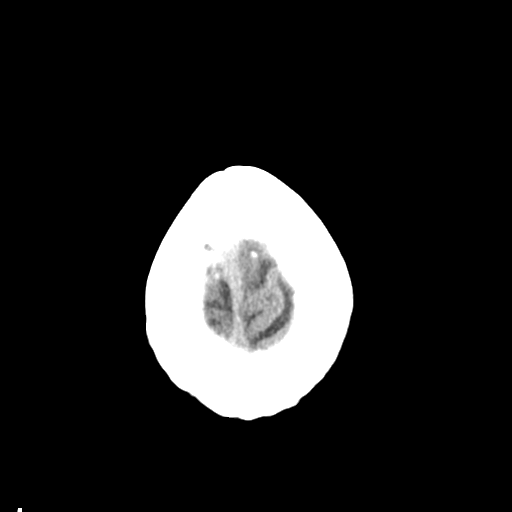
[im 28/31  bone]
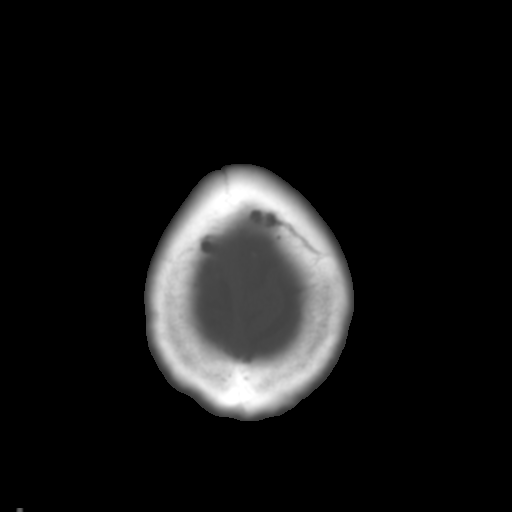

[Series 4: coronal soft tissue · coronal · 0.33mm/px · 3 of 70 slices shown]
[im 24/70  brain]
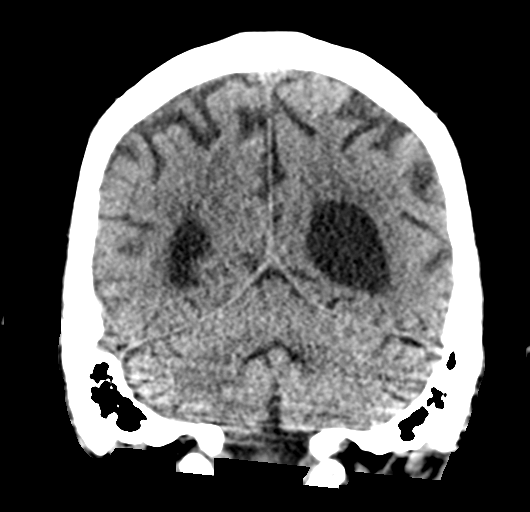
[im 31/70  brain]
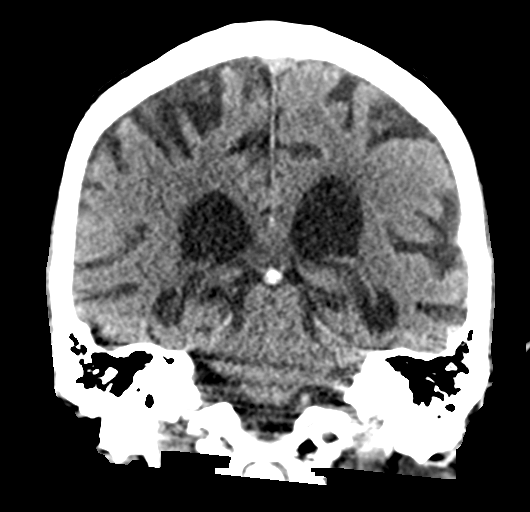
[im 39/70  brain]
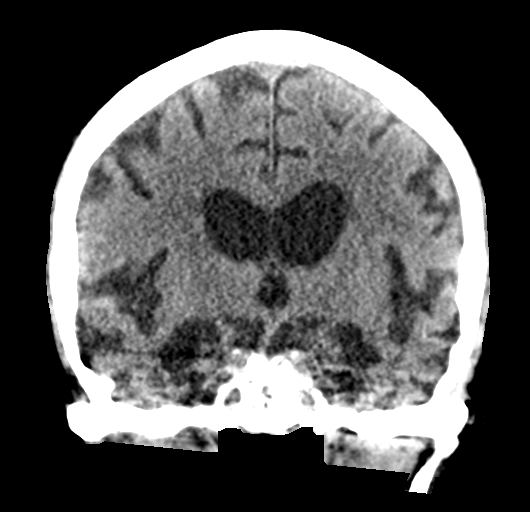

[Series 5: sagittal soft tissue · sagittal · 0.35mm/px · 3 of 59 slices shown]
[im 20/59  brain]
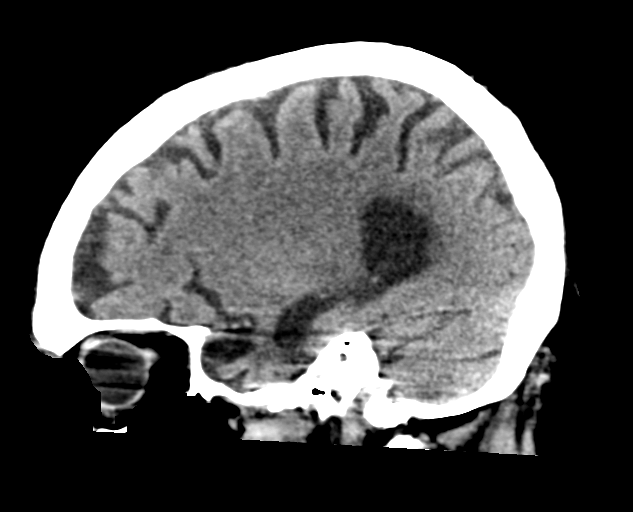
[im 30/59  brain]
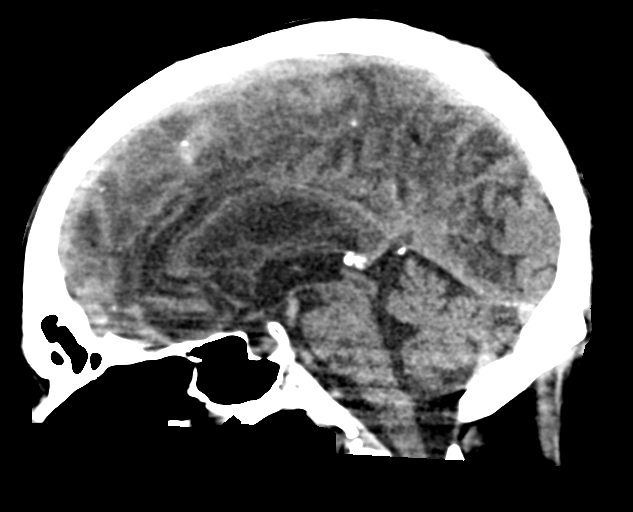
[im 39/59  brain]
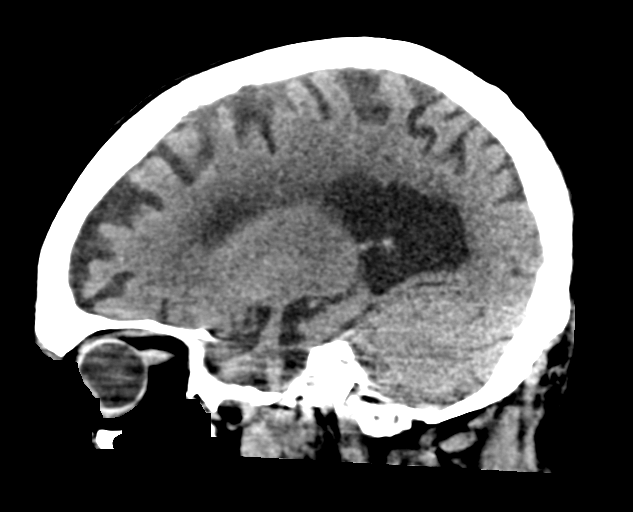

[15 of 47 positions shown; findings below may reference images not displayed]

FINDINGS: Brain:

Moderate cerebral atrophy, most prominent within the frontal and
temporal lobes. Commensurate prominence of the ventricles and sulci.

Mild patchy and ill-defined hypoattenuation within the cerebral
white matter, nonspecific but compatible with chronic small vessel
ischemic disease.

There is no acute intracranial hemorrhage.

No demarcated cortical infarct.

No extra-axial fluid collection.

No evidence of an intracranial mass.

No midline shift.

Vascular: No hyperdense vessel.  Atherosclerotic calcifications.

Skull: Normal. Negative for fracture or focal lesion.

Sinuses/Orbits: Visualized orbits show no acute finding.
Incompletely imaged left maxillary sinus mucous retention cyst
measuring at least 19 mm.
IMPRESSION: No evidence of acute intracranial abnormality.

Moderate cerebral atrophy, most prominent within the frontal and
temporal lobes.

Mild chronic small-vessel ischemic changes within the cerebral white
matter.

Incompletely imaged left maxillary sinus mucous retention cyst.
